# Patient Record
Sex: Male | Born: 1975 | Race: White | Hispanic: No | Marital: Married | Smoking: Never smoker
Health system: Southern US, Community
[De-identification: ages and names within clinical notes are randomized; demographics above are authoritative.]

## PROBLEM LIST (undated history)

## (undated) DIAGNOSIS — J45909 Unspecified asthma, uncomplicated: Secondary | ICD-10-CM

## (undated) DIAGNOSIS — N200 Calculus of kidney: Secondary | ICD-10-CM

## (undated) DIAGNOSIS — I10 Essential (primary) hypertension: Secondary | ICD-10-CM

## (undated) HISTORY — DX: Essential (primary) hypertension: I10

## (undated) HISTORY — PX: SPLENECTOMY: SUR1306

---
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
## 2000-11-16 ENCOUNTER — Encounter: Payer: Self-pay | Admitting: Emergency Medicine

## 2000-11-16 ENCOUNTER — Emergency Department (HOSPITAL_COMMUNITY): Admission: EM | Admit: 2000-11-16 | Discharge: 2000-11-16 | Payer: Self-pay | Admitting: Emergency Medicine

## 2003-09-22 ENCOUNTER — Emergency Department (HOSPITAL_COMMUNITY): Admission: EM | Admit: 2003-09-22 | Discharge: 2003-09-22 | Payer: Self-pay | Admitting: Emergency Medicine

## 2008-01-05 ENCOUNTER — Emergency Department (HOSPITAL_COMMUNITY): Admission: EM | Admit: 2008-01-05 | Discharge: 2008-01-05 | Payer: Self-pay | Admitting: Emergency Medicine

## 2008-11-12 ENCOUNTER — Ambulatory Visit: Payer: Self-pay | Admitting: Diagnostic Radiology

## 2008-11-12 ENCOUNTER — Ambulatory Visit (HOSPITAL_BASED_OUTPATIENT_CLINIC_OR_DEPARTMENT_OTHER): Admission: RE | Admit: 2008-11-12 | Discharge: 2008-11-12 | Payer: Self-pay | Admitting: Family Medicine

## 2010-10-11 ENCOUNTER — Emergency Department (HOSPITAL_COMMUNITY)
Admission: EM | Admit: 2010-10-11 | Discharge: 2010-10-12 | Payer: Self-pay | Source: Home / Self Care | Admitting: Emergency Medicine

## 2011-01-09 LAB — URINALYSIS, ROUTINE W REFLEX MICROSCOPIC
Bilirubin Urine: NEGATIVE
Glucose, UA: NEGATIVE mg/dL
Ketones, ur: NEGATIVE mg/dL
Nitrite: NEGATIVE
Protein, ur: NEGATIVE mg/dL
Specific Gravity, Urine: 1.027 (ref 1.005–1.030)
Urobilinogen, UA: 0.2 mg/dL (ref 0.0–1.0)
pH: 5.5 (ref 5.0–8.0)

## 2011-01-09 LAB — DIFFERENTIAL
Eosinophils Absolute: 0.2 10*3/uL (ref 0.0–0.7)
Lymphocytes Relative: 18 % (ref 12–46)
Lymphs Abs: 3.6 10*3/uL (ref 0.7–4.0)
Monocytes Relative: 7 % (ref 3–12)
Neutrophils Relative %: 73 % (ref 43–77)

## 2011-01-09 LAB — COMPREHENSIVE METABOLIC PANEL
ALT: 33 U/L (ref 0–53)
AST: 20 U/L (ref 0–37)
CO2: 28 mEq/L (ref 19–32)
Calcium: 8.7 mg/dL (ref 8.4–10.5)
Creatinine, Ser: 1.02 mg/dL (ref 0.4–1.5)
GFR calc Af Amer: >60 mL/min (ref >60)
GFR calc non Af Amer: >60 mL/min (ref >60)
Glucose, Bld: 105 mg/dL — ABNORMAL HIGH (ref 70–99)
Sodium: 140 mEq/L (ref 135–145)
Total Protein: 7 g/dL (ref 6.0–8.3)

## 2011-01-09 LAB — URINE MICROSCOPIC-ADD ON

## 2011-01-09 LAB — CBC
MCH: 32.2 pg (ref 26.0–34.0)
MCHC: 33.5 g/dL (ref 30.0–36.0)
RDW: 14 % (ref 11.5–15.5)

## 2011-07-23 LAB — CBC
Platelets: 355
RBC: 5.2
WBC: 23 — ABNORMAL HIGH

## 2011-07-23 LAB — COMPREHENSIVE METABOLIC PANEL
ALT: 59 — ABNORMAL HIGH
AST: 26
Albumin: 4.6
Alkaline Phosphatase: 78
CO2: 26
Chloride: 102
Creatinine, Ser: 1.04
GFR calc Af Amer: >60
GFR calc non Af Amer: >60
Potassium: 3.8
Sodium: 138
Total Bilirubin: 1

## 2011-07-23 LAB — DIFFERENTIAL
Basophils Absolute: 0
Eosinophils Absolute: 0
Eosinophils Relative: 0
Lymphocytes Relative: 3 — ABNORMAL LOW
Monocytes Absolute: 1.1 — ABNORMAL HIGH

## 2011-07-23 LAB — URINE MICROSCOPIC-ADD ON

## 2011-07-23 LAB — URINALYSIS, ROUTINE W REFLEX MICROSCOPIC
Glucose, UA: NEGATIVE
Hgb urine dipstick: NEGATIVE
Protein, ur: 30 — AB
pH: 6

## 2012-01-10 IMAGING — CT CT ABD-PELV W/O CM
2 of 4 series · 17 of 46 positions shown, 19 images · non-contrast
Comparison: None

CLINICAL DATA: Abdominal pain

CT ABDOMEN AND PELVIS WITHOUT CONTRAST
TECHNIQUE: Multidetector CT imaging of the abdomen and pelvis was
performed following the standard protocol without intravenous
contrast.

[Series 2: renal stone · axial · 0.82mm/px · z∈[-432,-17]mm · 14 of 91 slices shown, 16 images]
[im 4/91  soft-tissue]
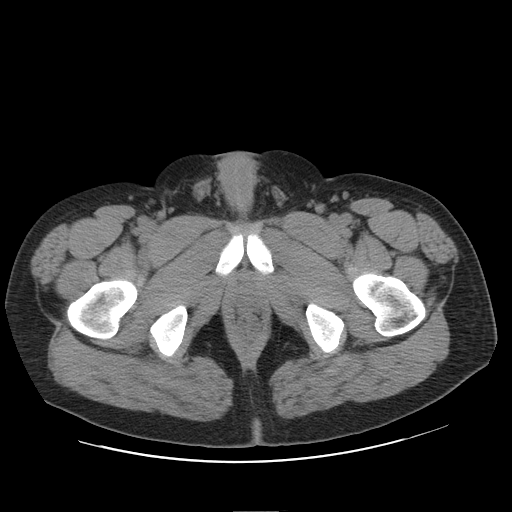
[im 4/91  bone]
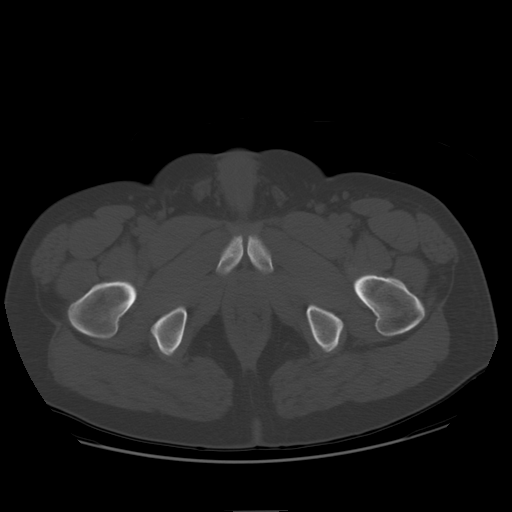
[im 12/91  soft-tissue]
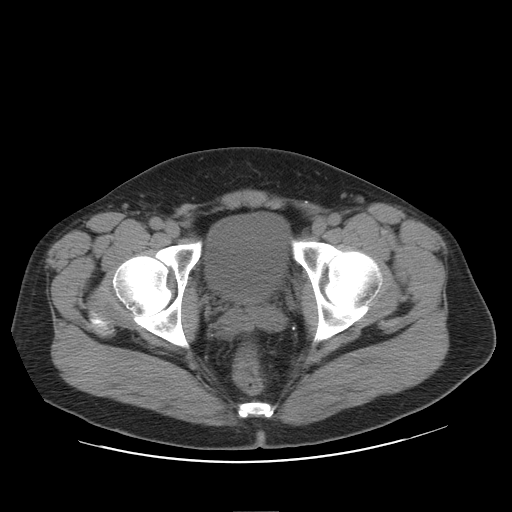
[im 16/91  soft-tissue]
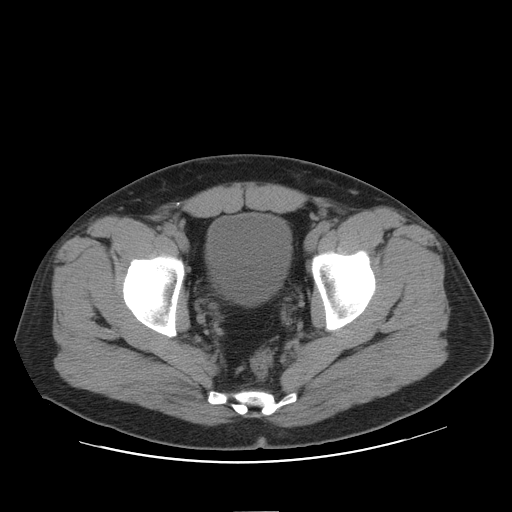
[im 24/91  soft-tissue]
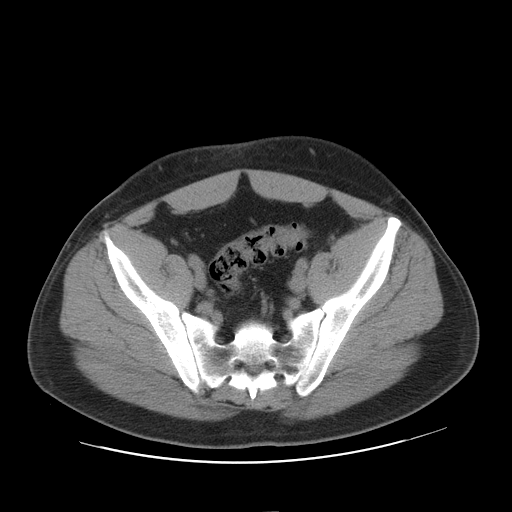
[im 32/91  soft-tissue]
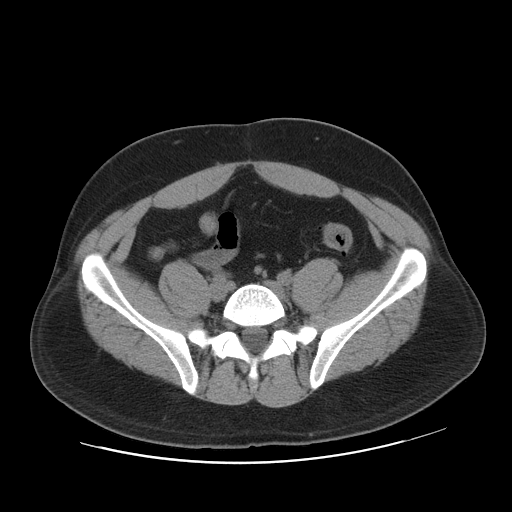
[im 36/91  soft-tissue]
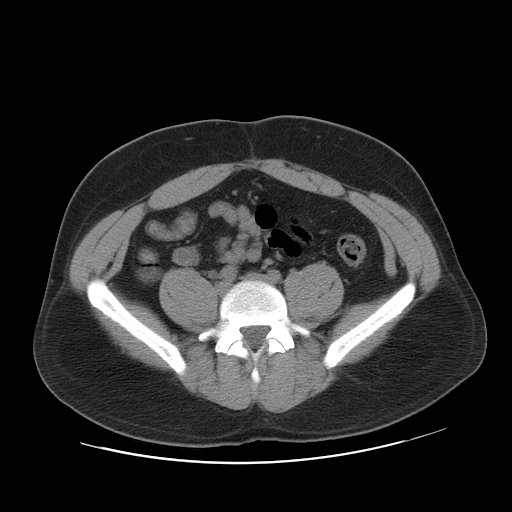
[im 44/91  soft-tissue]
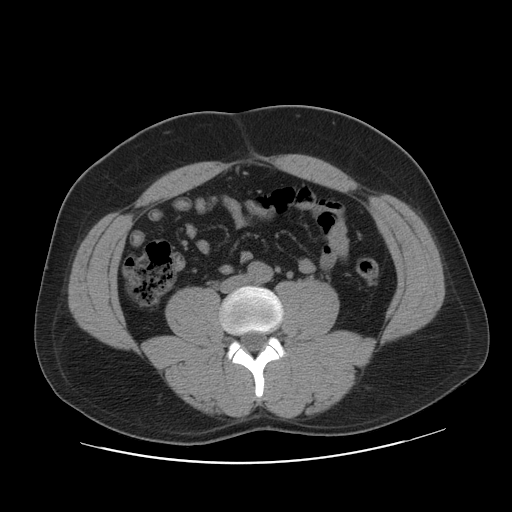
[im 47/91  soft-tissue]
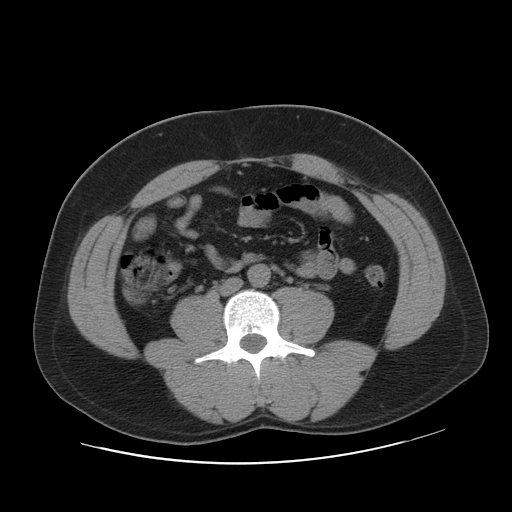
[im 55/91  soft-tissue]
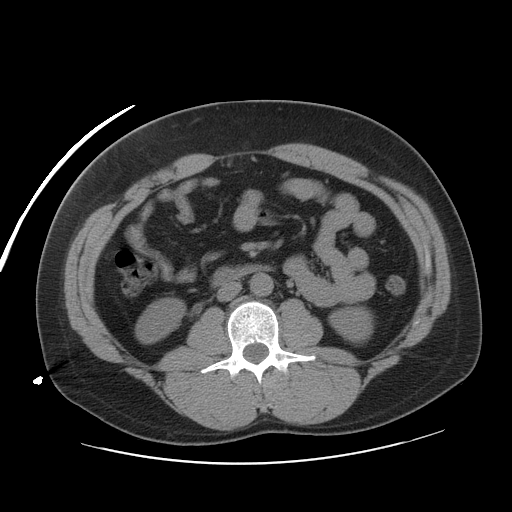
[im 55/91  bone]
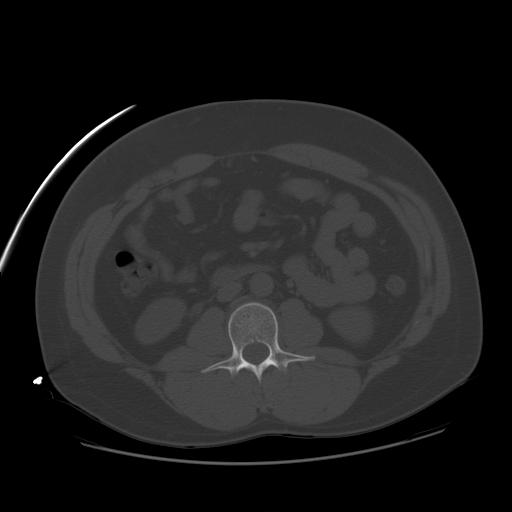
[im 59/91  soft-tissue]
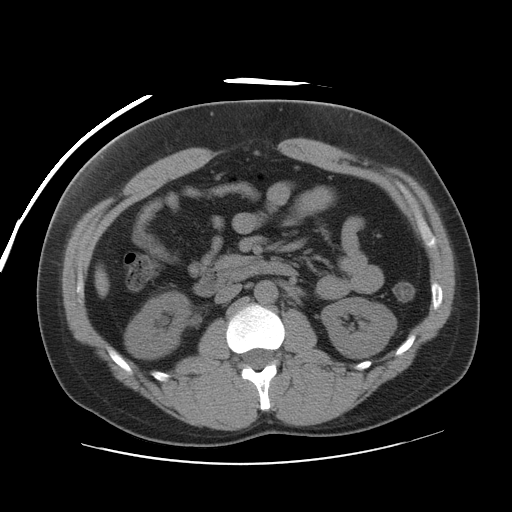
[im 67/91  soft-tissue]
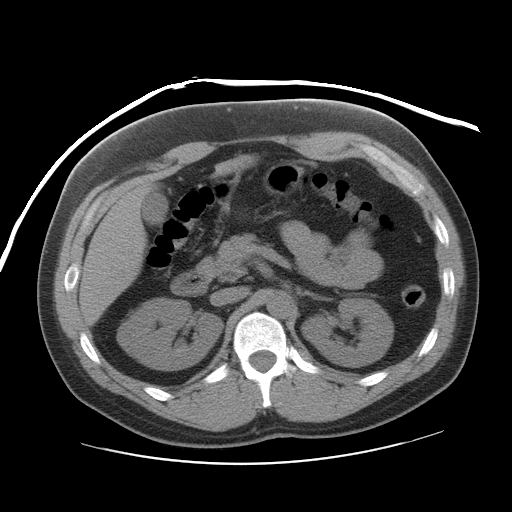
[im 75/91  soft-tissue]
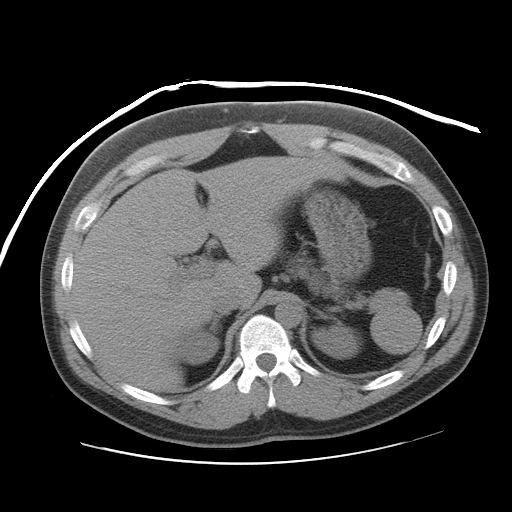
[im 79/91  soft-tissue]
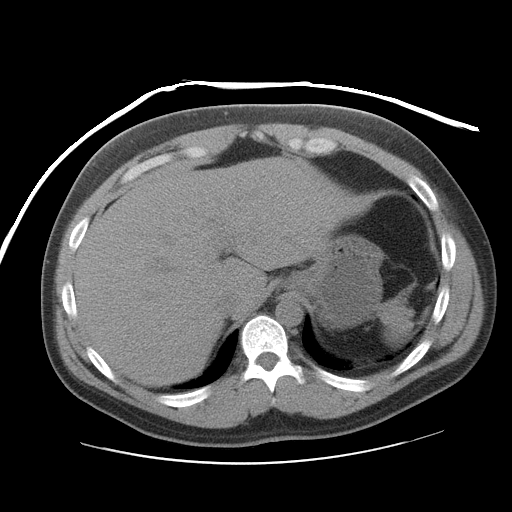
[im 87/91  soft-tissue]
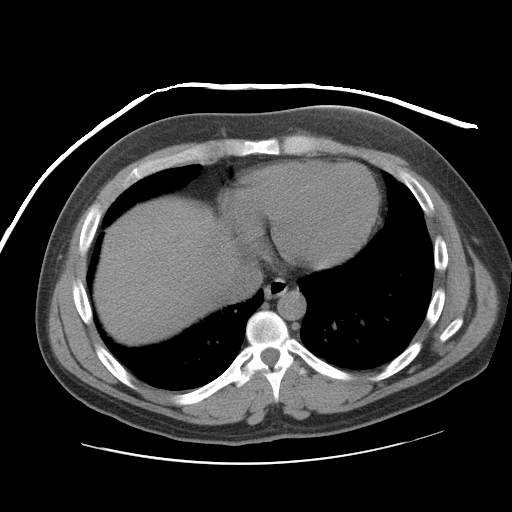

[Series 401: reformatted · coronal · 0.98mm/px · 3 of 103 slices shown]
[im 35/103  soft-tissue]
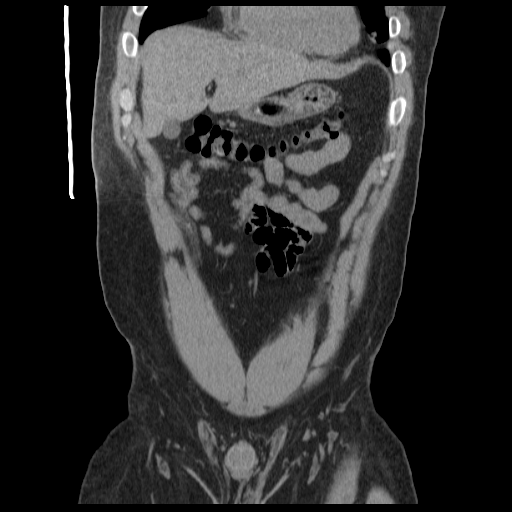
[im 46/103  soft-tissue]
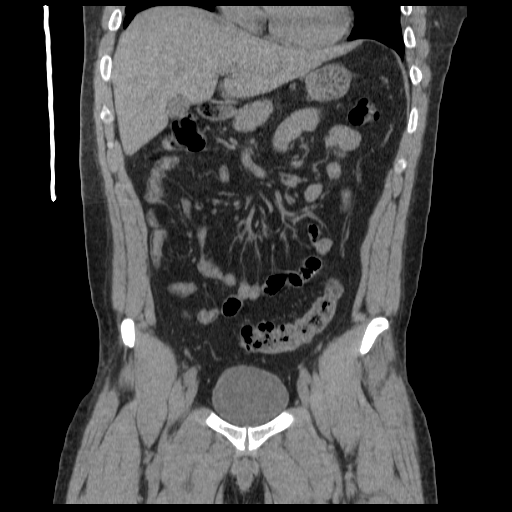
[im 57/103  soft-tissue]
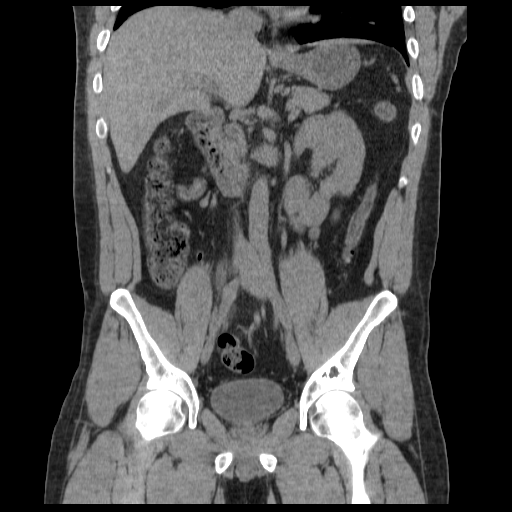

[17 of 46 positions shown; findings below may reference images not displayed]

FINDINGS: The lung bases are clear.

Status post splenectomy.  There is an accessory spleen identified
within the left upper quadrant of the abdomen measuring 3.9 cm.
The pancreas appears normal.

Both adrenal glands are normal.

No focal liver abnormality noted.

The gallbladder appears normal.  There is no biliary ductal
dilatation.  Pancreas is negative.  Both adrenal glands are
negative.

There is right-sided pelvocaliectasis and hydroureter.  Within the
urinary bladder at the right UVJ there is a stone which measures
approximately 2-3 mm, image 79.  There is a nonobstructing stone
within the inferior pole of the left kidney which measures 3-4 mm,
image 32.  No left-sided hydronephrosis or hydroureter.

No enlarged upper abdominal lymph nodes.

There is no inguinal or pelvic adenopathy.  The stomach and the
small bowel loops are normal.  There are scattered colonic
diverticuli without active inflammation.  No free fluid or fluid
collections within the abdomen or pelvis.
IMPRESSION: 1.  There is a tiny stone within the right UVJ which results and
mild right pelvocaliectasis and hydroureter.

## 2012-06-01 ENCOUNTER — Emergency Department (HOSPITAL_COMMUNITY): Payer: Self-pay

## 2012-06-01 ENCOUNTER — Inpatient Hospital Stay (HOSPITAL_COMMUNITY)
Admission: EM | Admit: 2012-06-01 | Discharge: 2012-06-04 | DRG: 392 | Disposition: A | Discharge status: Home / Self Care | Payer: MEDICAID | Attending: Family Medicine | Admitting: Family Medicine

## 2012-06-01 ENCOUNTER — Encounter (HOSPITAL_COMMUNITY): Payer: Self-pay | Admitting: Emergency Medicine

## 2012-06-01 DIAGNOSIS — D72829 Elevated white blood cell count, unspecified: Secondary | ICD-10-CM | POA: Diagnosis present

## 2012-06-01 DIAGNOSIS — K630 Abscess of intestine: Secondary | ICD-10-CM | POA: Diagnosis present

## 2012-06-01 DIAGNOSIS — N201 Calculus of ureter: Secondary | ICD-10-CM | POA: Diagnosis present

## 2012-06-01 DIAGNOSIS — K5732 Diverticulitis of large intestine without perforation or abscess without bleeding: Principal | ICD-10-CM | POA: Diagnosis present

## 2012-06-01 DIAGNOSIS — R109 Unspecified abdominal pain: Secondary | ICD-10-CM

## 2012-06-01 DIAGNOSIS — Z79899 Other long term (current) drug therapy: Secondary | ICD-10-CM

## 2012-06-01 DIAGNOSIS — N1330 Unspecified hydronephrosis: Secondary | ICD-10-CM | POA: Diagnosis present

## 2012-06-01 DIAGNOSIS — N200 Calculus of kidney: Secondary | ICD-10-CM | POA: Diagnosis present

## 2012-06-01 DIAGNOSIS — Z87442 Personal history of urinary calculi: Secondary | ICD-10-CM

## 2012-06-01 DIAGNOSIS — N179 Acute kidney failure, unspecified: Secondary | ICD-10-CM | POA: Diagnosis present

## 2012-06-01 DIAGNOSIS — K5792 Diverticulitis of intestine, part unspecified, without perforation or abscess without bleeding: Secondary | ICD-10-CM

## 2012-06-01 DIAGNOSIS — L0291 Cutaneous abscess, unspecified: Secondary | ICD-10-CM

## 2012-06-01 HISTORY — DX: Calculus of kidney: N20.0

## 2012-06-01 LAB — POCT I-STAT, CHEM 8
Calcium, Ion: 1.18 mmol/L (ref 1.12–1.23)
Chloride: 103 mEq/L (ref 96–112)
Creatinine, Ser: 1.3 mg/dL (ref 0.50–1.35)
Glucose, Bld: 102 mg/dL — ABNORMAL HIGH (ref 70–99)
Potassium: 3.6 mEq/L (ref 3.5–5.1)

## 2012-06-01 LAB — CBC WITH DIFFERENTIAL/PLATELET
Basophils Relative: 0 % (ref 0–1)
Eosinophils Relative: 1 % (ref 0–5)
HCT: 41.9 % (ref 39.0–52.0)
Hemoglobin: 14.1 g/dL (ref 13.0–17.0)
Lymphocytes Relative: 15 % (ref 12–46)
Monocytes Relative: 8 % (ref 3–12)
Neutro Abs: 15.9 10*3/uL — ABNORMAL HIGH (ref 1.7–7.7)
WBC: 21 10*3/uL — ABNORMAL HIGH (ref 4.0–10.5)

## 2012-06-01 LAB — URINALYSIS, ROUTINE W REFLEX MICROSCOPIC
Bilirubin Urine: NEGATIVE
Ketones, ur: NEGATIVE mg/dL
Specific Gravity, Urine: 1.024 (ref 1.005–1.030)
Urobilinogen, UA: 0.2 mg/dL (ref 0.0–1.0)
pH: 6.5 (ref 5.0–8.0)

## 2012-06-01 LAB — URINE MICROSCOPIC-ADD ON

## 2012-06-01 MED ORDER — HYDROMORPHONE HCL PF 1 MG/ML IJ SOLN: 1.0000 mg | INTRAMUSCULAR | Status: DC | PRN

## 2012-06-01 MED ORDER — SODIUM CHLORIDE 0.9 % IV SOLN: INTRAVENOUS | Status: AC

## 2012-06-01 MED ORDER — PIPERACILLIN-TAZOBACTAM 3.375 G IVPB
3.3750 g | Freq: Three times a day (TID) | INTRAVENOUS | Status: DC
  Administered 2012-06-02 – 2012-06-04 (×9): 3.375 g via INTRAVENOUS
  Filled 2012-06-01 (×10): qty 50

## 2012-06-01 MED ORDER — PIPERACILLIN-TAZOBACTAM 3.375 G IVPB: 3.3750 g | Freq: Four times a day (QID) | INTRAVENOUS | Status: DC

## 2012-06-01 MED ORDER — TAMSULOSIN HCL 0.4 MG PO CAPS: 0.4000 mg | ORAL_CAPSULE | Freq: Every day | ORAL | Status: DC

## 2012-06-01 MED ORDER — HYDROMORPHONE HCL PF 1 MG/ML IJ SOLN
1.0000 mg | Freq: Once | INTRAMUSCULAR | Status: AC
  Administered 2012-06-01: 1 mg via INTRAVENOUS
  Filled 2012-06-01: qty 1

## 2012-06-01 MED ORDER — IBUPROFEN 600 MG PO TABS: 600.0000 mg | ORAL_TABLET | Freq: Four times a day (QID) | ORAL | Status: DC | PRN

## 2012-06-01 MED ORDER — KETOROLAC TROMETHAMINE 30 MG/ML IJ SOLN
30.0000 mg | Freq: Once | INTRAMUSCULAR | Status: AC
  Administered 2012-06-01: 30 mg via INTRAVENOUS
  Filled 2012-06-01: qty 1

## 2012-06-01 MED ORDER — OXYCODONE-ACETAMINOPHEN 5-325 MG PO TABS: 2.0000 | ORAL_TABLET | ORAL | Status: DC | PRN

## 2012-06-01 MED ORDER — ONDANSETRON HCL 4 MG/2ML IJ SOLN
4.0000 mg | Freq: Once | INTRAMUSCULAR | Status: AC
  Administered 2012-06-01: 4 mg via INTRAVENOUS
  Filled 2012-06-01: qty 2

## 2012-06-01 MED ORDER — ONDANSETRON HCL 4 MG/2ML IJ SOLN: 4.0000 mg | Freq: Four times a day (QID) | INTRAMUSCULAR | Status: DC | PRN

## 2012-06-01 MED ORDER — OXYCODONE-ACETAMINOPHEN 5-325 MG PO TABS
1.0000 | ORAL_TABLET | ORAL | Status: DC | PRN
  Administered 2012-06-02 – 2012-06-03 (×3): 1 via ORAL
  Filled 2012-06-01 (×3): qty 1

## 2012-06-01 MED ORDER — TAMSULOSIN HCL 0.4 MG PO CAPS
0.4000 mg | ORAL_CAPSULE | Freq: Every day | ORAL | Status: DC
  Administered 2012-06-02 – 2012-06-03 (×3): 0.4 mg via ORAL
  Filled 2012-06-01 (×4): qty 1

## 2012-06-01 MED ORDER — METRONIDAZOLE IN NACL 5-0.79 MG/ML-% IV SOLN
500.0000 mg | Freq: Once | INTRAVENOUS | Status: AC
  Administered 2012-06-01: 500 mg via INTRAVENOUS
  Filled 2012-06-01: qty 100

## 2012-06-01 MED ORDER — CIPROFLOXACIN IN D5W 400 MG/200ML IV SOLN
400.0000 mg | Freq: Once | INTRAVENOUS | Status: AC
  Administered 2012-06-01: 400 mg via INTRAVENOUS
  Filled 2012-06-01: qty 200

## 2012-06-01 NOTE — ED Provider Notes (Signed)
History     CSN: [id number]  Arrival date & time 06/01/12  1728   First MD Initiated Contact with Patient 06/01/12 1913      Chief Complaint  Patient presents with  . Flank Pain    (Consider location/radiation/quality/duration/timing/severity/associated sxs/prior treatment) Patient is a 36 y.o. male presenting with flank pain. The history is provided by the patient.  Flank Pain This is a new problem. The current episode started today. The problem occurs constantly. Associated symptoms include abdominal pain and nausea. Pertinent negatives include no chills, diaphoresis, fever, headaches, myalgias, vomiting or weakness. Nothing aggravates the symptoms. He has tried nothing for the symptoms.  PT states pain sudden onset, waxing and waning. Hx of kidney stone, feels the same. No prior lithotripsy. No urinary symptoms or hematuria. No fever.   Past Medical History  Diagnosis Date  . Kidney stones     History reviewed. No pertinent past surgical history.  No family history on file.  History  Substance Use Topics  . Smoking status: Never Smoker   . Smokeless tobacco: Not on file  . Alcohol Use: Yes      Review of Systems  Constitutional: Negative for fever, chills and diaphoresis.  Respiratory: Negative.   Cardiovascular: Negative.   Gastrointestinal: Positive for nausea and abdominal pain. Negative for vomiting, diarrhea and constipation.  Genitourinary: Positive for flank pain. Negative for dysuria, hematuria, scrotal swelling, difficulty urinating and testicular pain.  Musculoskeletal: Negative for myalgias.  Neurological: Negative for dizziness, weakness, light-headedness and headaches.    Allergies  Review of patient's allergies indicates no known allergies.  Home Medications  No current outpatient prescriptions on file.  BP 167/109  Pulse 79  Temp 99 F (37.2 C) (Oral)  Resp 18  SpO2 98%  Physical Exam  Nursing note and vitals reviewed. Constitutional: He  is oriented to person, place, and time. He appears well-developed and well-nourished. No distress.  HENT:  Head: Normocephalic.  Eyes: Conjunctivae are normal.  Neck: Neck supple.  Cardiovascular: Normal rate, regular rhythm and normal heart sounds.   Pulmonary/Chest: Effort normal and breath sounds normal. No respiratory distress. He has no wheezes. He has no rales.  Abdominal: Soft. Bowel sounds are normal. He exhibits no distension. There is no rebound and no guarding.       No CVA tenderness, mild LUQ tenderness  Musculoskeletal: He exhibits no edema.  Neurological: He is alert and oriented to person, place, and time.  Skin: Skin is warm and dry.  Psychiatric: He has a normal mood and affect.    ED Course  Procedures (including critical care time)  Pt with left flank pain, acute onset, colicky, feels like prior kidney stones. UA, CT abd ordered, pain meds ordered.   Results for orders placed during the hospital encounter of 06/01/12  URINALYSIS, ROUTINE W REFLEX MICROSCOPIC      Component Value Range   Color, Urine YELLOW  YELLOW   APPearance CLOUDY (*) CLEAR   Specific Gravity, Urine 1.024  1.005 - 1.030   pH 6.5  5.0 - 8.0   Glucose, UA NEGATIVE  NEGATIVE mg/dL   Hgb urine dipstick LARGE (*) NEGATIVE   Bilirubin Urine NEGATIVE  NEGATIVE   Ketones, ur NEGATIVE  NEGATIVE mg/dL   Protein, ur NEGATIVE  NEGATIVE mg/dL   Urobilinogen, UA 0.2  0.0 - 1.0 mg/dL   Nitrite NEGATIVE  NEGATIVE   Leukocytes, UA NEGATIVE  NEGATIVE  POCT I-STAT, CHEM 8      Component Value Range  Sodium 141  135 - 145 mEq/L   Potassium 3.6  3.5 - 5.1 mEq/L   Chloride 103  96 - 112 mEq/L   BUN 15  6 - 23 mg/dL   Creatinine, Ser 1.61  0.50 - 1.35 mg/dL   Glucose, Bld 096 (*) 70 - 99 mg/dL   Calcium, Ion 0.45  4.09 - 1.23 mmol/L   TCO2 25  0 - 100 mmol/L   Hemoglobin 15.0  13.0 - 17.0 g/dL   HCT 81.1  91.4 - 78.2 %  URINE MICROSCOPIC-ADD ON      Component Value Range   RBC / HPF 11-20  <3 RBC/hpf    Crystals CA OXALATE CRYSTALS (*) NEGATIVE   Urine-Other AMORPHOUS URATES/PHOSPHATES    CBC WITH DIFFERENTIAL      Component Value Range   WBC 21.0 (*) 4.0 - 10.5 K/uL   RBC 4.49  4.22 - 5.81 MIL/uL   Hemoglobin 14.1  13.0 - 17.0 g/dL   HCT 95.6  21.3 - 08.6 %   MCV 93.3  78.0 - 100.0 fL   MCH 31.4  26.0 - 34.0 pg   MCHC 33.7  30.0 - 36.0 g/dL   RDW 57.8  46.9 - 62.9 %   Platelets 427 (*) 150 - 400 K/uL   Neutrophils Relative 76  43 - 77 %   Lymphocytes Relative 15  12 - 46 %   Monocytes Relative 8  3 - 12 %   Eosinophils Relative 1  0 - 5 %   Basophils Relative 0  0 - 1 %   Neutro Abs 15.9 (*) 1.7 - 7.7 K/uL   Lymphs Abs 3.2  0.7 - 4.0 K/uL   Monocytes Absolute 1.7 (*) 0.1 - 1.0 K/uL   Eosinophils Absolute 0.2  0.0 - 0.7 K/uL   Basophils Absolute 0.0  0.0 - 0.1 K/uL   Smear Review MORPHOLOGY UNREMARKABLE    CBC      Component Value Range   WBC 12.5 (*) 4.0 - 10.5 K/uL   RBC 3.94 (*) 4.22 - 5.81 MIL/uL   Hemoglobin 12.5 (*) 13.0 - 17.0 g/dL   HCT 52.8 (*) 41.3 - 24.4 %   MCV 92.6  78.0 - 100.0 fL   MCH 31.7  26.0 - 34.0 pg   MCHC 34.2  30.0 - 36.0 g/dL   RDW 01.0  27.2 - 53.6 %   Platelets 357  150 - 400 K/uL  BASIC METABOLIC PANEL      Component Value Range   Sodium 140  135 - 145 mEq/L   Potassium 3.1 (*) 3.5 - 5.1 mEq/L   Chloride 104  96 - 112 mEq/L   CO2 27  19 - 32 mEq/L   Glucose, Bld 101 (*) 70 - 99 mg/dL   BUN 8  6 - 23 mg/dL   Creatinine, Ser 6.44 (*) 0.50 - 1.35 mg/dL   Calcium 8.1 (*) 8.4 - 10.5 mg/dL   GFR calc non Af Amer 64 (*) >90 mL/min   GFR calc Af Amer 74 (*) >90 mL/min    Pt's pain improved. Signed out at shift change. CT pending.       No diagnosis found.    MDM          Lottie Mussel, PA 06/03/12 1642

## 2012-06-01 NOTE — Consult Note (Signed)
Reason for Consult:diverticulitis with abscess Referring Physician: Pickering/Anne Dominic Mahaney Yates is an 36 y.o. male.  HPI: we were consulted to evaluate this patient for diverticulitis and abscess found on CT scan which was ordered for a left kidney stone. He has a history of kidney stones and he presented to the emergency room this afternoon complaining of left back and flank pain which she describes as "sharp" and intermittent similar to his prior episodes. He denies any abdominal pain. He says that he had some nausea secondary to the pain which at times was rated as a 10 out of 10 pain but currently he only has mild pain which she grades as a 2/10. He denies any hematuria or fevers or chills or vomiting. He denies any blood in the stools or melena.  Past Medical History  Diagnosis Date  . Kidney stones     History reviewed. No pertinent past surgical history.  No family history on file.  Social History:  reports that he has never smoked. He does not have any smokeless tobacco history on file. He reports that he drinks alcohol. He reports that he does not use illicit drugs.  Allergies: No Known Allergies  Medications: I have reviewed the patient's current medications.  Results for orders placed during the hospital encounter of 06/01/12 (from the past 48 hour(s))  URINALYSIS, ROUTINE W REFLEX MICROSCOPIC     Status: Abnormal   Collection Time   06/01/12  7:25 PM      Component Value Range Comment   Color, Urine YELLOW  YELLOW    APPearance CLOUDY (*) CLEAR    Specific Gravity, Urine 1.024  1.005 - 1.030    pH 6.5  5.0 - 8.0    Glucose, UA NEGATIVE  NEGATIVE mg/dL    Hgb urine dipstick LARGE (*) NEGATIVE    Bilirubin Urine NEGATIVE  NEGATIVE    Ketones, ur NEGATIVE  NEGATIVE mg/dL    Protein, ur NEGATIVE  NEGATIVE mg/dL    Urobilinogen, UA 0.2  0.0 - 1.0 mg/dL    Nitrite NEGATIVE  NEGATIVE    Leukocytes, UA NEGATIVE  NEGATIVE   URINE MICROSCOPIC-ADD ON     Status:  Abnormal   Collection Time   06/01/12  7:25 PM      Component Value Range Comment   RBC / HPF 11-20  <3 RBC/hpf    Crystals CA OXALATE CRYSTALS (*) NEGATIVE    Urine-Other AMORPHOUS URATES/PHOSPHATES     CBC WITH DIFFERENTIAL     Status: Abnormal   Collection Time   06/01/12  7:34 PM      Component Value Range Comment   WBC 21.0 (*) 4.0 - 10.5 K/uL    RBC 4.49  4.22 - 5.81 MIL/uL    Hemoglobin 14.1  13.0 - 17.0 g/dL    HCT 16.1  09.6 - 04.5 %    MCV 93.3  78.0 - 100.0 fL    MCH 31.4  26.0 - 34.0 pg    MCHC 33.7  30.0 - 36.0 g/dL    RDW 40.9  81.1 - 91.4 %    Platelets 427 (*) 150 - 400 K/uL    Neutrophils Relative 76  43 - 77 %    Lymphocytes Relative 15  12 - 46 %    Monocytes Relative 8  3 - 12 %    Eosinophils Relative 1  0 - 5 %    Basophils Relative 0  0 - 1 %  Neutro Abs 15.9 (*) 1.7 - 7.7 K/uL    Lymphs Abs 3.2  0.7 - 4.0 K/uL    Monocytes Absolute 1.7 (*) 0.1 - 1.0 K/uL    Eosinophils Absolute 0.2  0.0 - 0.7 K/uL    Basophils Absolute 0.0  0.0 - 0.1 K/uL    Smear Review MORPHOLOGY UNREMARKABLE     POCT I-STAT, CHEM 8     Status: Abnormal   Collection Time   06/01/12  7:51 PM      Component Value Range Comment   Sodium 141  135 - 145 mEq/L    Potassium 3.6  3.5 - 5.1 mEq/L    Chloride 103  96 - 112 mEq/L    BUN 15  6 - 23 mg/dL    Creatinine, Ser 1.61  0.50 - 1.35 mg/dL    Glucose, Bld 096 (*) 70 - 99 mg/dL    Calcium, Ion 0.45  4.09 - 1.23 mmol/L    TCO2 25  0 - 100 mmol/L    Hemoglobin 15.0  13.0 - 17.0 g/dL    HCT 81.1  91.4 - 78.2 %     Ct Abdomen Pelvis Wo Contrast  06/01/2012  *RADIOLOGY REPORT*  Clinical Data: Flank pain, history of stone disease, low pelvic pain, prior splenectomy  CT ABDOMEN AND PELVIS WITHOUT CONTRAST  Technique:  Multidetector CT imaging of the abdomen and pelvis was performed following the standard protocol without intravenous contrast.  Comparison: 10/11/2010  Findings: Lung bases clear.  Normal heart size.  No pericardial or pleural  effusion.  No hiatal hernia.  Abdomen:  Prior splenectomy noted.  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  Liver, gallbladder, biliary system, pancreas, adrenal glands, and right kidney demonstrate no acute process and are within normal limits for noncontrast study.  Left kidney demonstrates acute perinephric inflammation with mild hydronephrosis hydroureter secondary to a proximal obstructing left ureteral 6 mm calculus, image 50.  No additional lower urinary tract obstructing calculus evident. Incidental punctate nonobstructing left lower pole renal calculus.  Additionally, left lower quadrant inflammatory changes are noted about the sigmoid colon.  Diverticular disease noted in this region.  Small extraluminal fluid collection noted anterior to the sigmoid colon measuring 24 x 16 mm, image 70.  This is compatible with acute diverticulitis and a small pericolonic abscess.  No associate obstruction pattern or ileus.  Normal appendix demonstrated.  Pelvis:  No distal pelvic ureteral obstruction or UVJ abnormality. Unremarkable bladder.  No pelvic free fluid, hemorrhage, adenopathy, inguinal abnormality, or hernia.  No acute osseous finding.  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  IMPRESSION: Acute obstructing proximal left ureteral 6 mm calculus with mild hydronephrosis and hydroureter.  Acute left lower quadrant sigmoid diverticulitis with an adjacent small 2 cm pericolonic abscess.  Original Report Authenticated By: Judie Petit. Ruel Favors, M.D.   All other review of systems negative or noncontributory except as stated in the HPI  Blood pressure 152/109, pulse 74, temperature 98.7 F (37.1 C), temperature source Oral, resp. rate 16, SpO2 94.00%. General appearance: alert, cooperative and no distress Head: Normocephalic, without obvious abnormality, atraumatic Eyes: conjunctivae/corneas clear. PERRL, EOM's intact.  Fundi benign. Neck: no JVD and supple, symmetrical, trachea midline Back: negative, symmetric, no curvature. ROM normal. No CVA tenderness. Resp: clear to auscultation bilaterally Cardio: regular rate and rhythm, S1, S2 normal, no murmur, click, rub or gallop GI: soft, non-tender; bowel sounds normal; no masses,  no organomegaly  and completely nontender on exam Extremities: extremities normal, atraumatic, no cyanosis or edema Pulses: 2+ and symmetric Skin: Skin color, texture, turgor normal. No rashes or lesions Neurologic: Grossly normal  Assessment/Plan: Diverticulits with possible abscess Left kidney stone Leukocytosis  This is an interesting situation. He appears to have diverticulitis with possible abscess on CT scan but he has no fevers or abdominal pain or any other manifestations of this. He has symptoms of consistent with a left kidney stone and he says that this episode is exactly the same as his prior episodes of kidney stones. However, if he does have a white blood cell count of 21,000. This may be due to his kidney stone but could also be diverticulitis as well. Though I do not think that any surgery is needed at this time, I think it is reasonable to admit him for bowel rest and IV fluids and antibiotic therapy. I would treat his diverticulitis clinically. Again, do not think any surgical intervention would be necessary at this time and he should do fine with nonoperative treatment including antibiotics and bowel rest. Surgery will follow.  Lodema Pilot DAVID 06/01/2012, 11:14 PM

## 2012-06-01 NOTE — H&P (Signed)
[facility] History and Physical  Jeremiah Yates KGM:[id number] DOB: 01-30-1976 DOA: 06/01/2012  Referring physician: Cameron Proud PCP: No primary provider on file.   Chief Complaint: left back and flank pain  HPI:  36 yo male with hx of nephrolithiasis c/o left back and flank starting today about 2pm.  Slight subjective fever ("feeling hot"), but denies chills, n/v, abd pain, diarrhea, constipation, brbrp, black stool.  Pt came to ED for evaluation.    In ED CT scan showed 2cm diverticular abscess and evidence of nephrolithiasis.  ED has consulted surgery and Urology, and surgery thinks medical management of the diverticular abscess is the most prudent course of action at this time with iv abx. Urology will be by to evaluate the patient in terms of the nephrolithiasis and hydronephrosis.  We appreciate their input.   Review of Systems:  Negative for all organ systems except for + above hpi  Past Medical History  Diagnosis Date  . Kidney stones    Past Surgical History  Procedure Date  . Splenectomy circa 1998   Social History:  reports that he has never smoked. He has never used smokeless tobacco. He reports that he drinks about .5 ounces of alcohol per week. He reports that he does not use illicit drugs. Lives at home with partner Patient can  participate in ADLs  No Known Allergies  Family History  Problem Relation Age of Onset  . Lung cancer Father     alive at age 35  . Breast cancer Mother     alive at age 63     (be sure to complete)  Prior to Admission medications   Medication Sig Start Date End Date Taking? Authorizing Provider  ibuprofen (ADVIL,MOTRIN) 600 MG tablet Take 1 tablet (600 mg total) by mouth every 6 (six) hours as needed for pain. 06/01/12 06/11/12  Tatyana A Kirichenko, PA  oxyCODONE-acetaminophen (PERCOCET/ROXICET) 5-325 MG per tablet Take 2 tablets by mouth every 4 (four) hours as needed for pain. 06/01/12 06/11/12  Tatyana A Kirichenko, PA    Tamsulosin HCl (FLOMAX) 0.4 MG CAPS Take 1 capsule (0.4 mg total) by mouth daily. 06/01/12   Lottie Mussel, PA   Physical Exam: Filed Vitals:   06/01/12 1737 06/01/12 2306  BP: 167/109 152/109  Pulse: 79 74  Temp: 99 F (37.2 C) 98.7 F (37.1 C)  TempSrc: Oral Oral  Resp: 18 16  SpO2: 98% 94%     General:  Young male  Eyes: anicteric, pupils 1.6mm symmetric, direct, consensual intact  ENT: mmm  Neck: no jvd, no bruit, no tm  Cardiovascular: rrr s1, s2  Respiratory: ctab  Abdomen: soft, nt, nd, +bs,  Skin: no rash, + tatoo  Musculoskeletal: no muscle tone  Psychiatric: no depression axox3  Neurologic: nonfocal, cn2-12 intact, reflexes 2+ symmetric, diffuse with downgoing toes bilaterally, motor 5/5 in all 4 ext, pinprik intact  Labs on Admission:  Basic Metabolic Panel:  Lab 06/01/12 6440  NA 141  K 3.6  CL 103  CO2 --  GLUCOSE 102*  BUN 15  CREATININE 1.30  CALCIUM --  MG --  PHOS --   Liver Function Tests: No results found for this basename: AST:5,ALT:5,ALKPHOS:5,BILITOT:5,PROT:5,ALBUMIN:5 in the last 168 hours No results found for this basename: LIPASE:5,AMYLASE:5 in the last 168 hours No results found for this basename: AMMONIA:5 in the last 168 hours CBC:  Lab 06/01/12 1951 06/01/12 1934  WBC -- 21.0*  NEUTROABS -- 15.9*  HGB 15.0 14.1  HCT  44.0 41.9  MCV -- 93.3  PLT -- 427*   Cardiac Enzymes: No results found for this basename: CKTOTAL:5,CKMB:5,CKMBINDEX:5,TROPONINI:5 in the last 168 hours  BNP (last 3 results) No results found for this basename: PROBNP:3 in the last 8760 hours CBG: No results found for this basename: GLUCAP:5 in the last 168 hours  Radiological Exams on Admission: Ct Abdomen Pelvis Wo Contrast  06/01/2012  *RADIOLOGY REPORT*  Clinical Data: Flank pain, history of stone disease, low pelvic pain, prior splenectomy  CT ABDOMEN AND PELVIS WITHOUT CONTRAST  Technique:  Multidetector CT imaging of the abdomen and  pelvis was performed following the standard protocol without intravenous contrast.  Comparison: 10/11/2010  Findings: Lung bases clear.  Normal heart size.  No pericardial or pleural effusion.  No hiatal hernia.  Abdomen:  Prior splenectomy noted.  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  Liver, gallbladder, biliary system, pancreas, adrenal glands, and right kidney demonstrate no acute process and are within normal limits for noncontrast study.  Left kidney demonstrates acute perinephric inflammation with mild hydronephrosis hydroureter secondary to a proximal obstructing left ureteral 6 mm calculus, image 50.  No additional lower urinary tract obstructing calculus evident. Incidental punctate nonobstructing left lower pole renal calculus.  Additionally, left lower quadrant inflammatory changes are noted about the sigmoid colon.  Diverticular disease noted in this region.  Small extraluminal fluid collection noted anterior to the sigmoid colon measuring 24 x 16 mm, image 70.  This is compatible with acute diverticulitis and a small pericolonic abscess.  No associate obstruction pattern or ileus.  Normal appendix demonstrated.  Pelvis:  No distal pelvic ureteral obstruction or UVJ abnormality. Unremarkable bladder.  No pelvic free fluid, hemorrhage, adenopathy, inguinal abnormality, or hernia.  No acute osseous finding.  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  IMPRESSION: Acute obstructing proximal left ureteral 6 mm calculus with mild hydronephrosis and hydroureter.  Acute left lower quadrant sigmoid diverticulitis with an adjacent small 2 cm pericolonic abscess.  Original Report Authenticated By: Judie Petit. Ruel Favors, M.D.    EKG:   Assessment/Plan Active Problems:  Intestinal diverticular abscess  Nephrolithiasis   1. Diverticular abscess:  tx with iv zosyn, surgery consult, appreciate input, cbc, cmp in  am 2. Nephrolithiasis:  Urology has been consulted,  Appreciate input in light of hydronephrosis and hx of splenectomy, alpha blocker, pain control  Code Status: Full code Family Communication: Milinda Antis [phone] (partner) Disposition Plan:  Home after stable  Time spent: 35 minutes  Pearson Grippe [facility] Pager [phone] tonight only otherwise [facility] [employer] [email or web address] Password [email or web address] 06/01/2012, 11:32 PM

## 2012-06-01 NOTE — ED Provider Notes (Signed)
0800 Report received from [facility]. Patient is awaiting results of his CT scan and then we will disposition him.  0900 CT abdomen and pelvis shows acute obstructing proximal left. Ureteral 6 mm stone with mild hydronephrosis and hydroureter. CT also shows acute left lower quadrant sigmoid diverticulitis with an adjacent small 2 cm pericolonic abscess. Will discuss the case with Dr. Rubin Payor and consult surgery. 0945  spoke to Dr. Selena Batten with the hospitalist and he would prefer that I call surgery and urology before he acceps the patient. Spoke with surgery and they will see the patient in the ER later.   Dr. Vernice Jefferson with urology  will call Dr. Selena Batten. He states that he could send  the patient home with a 6 mm stone. Awaiting surgery to see pt before admitting to either surgery or hospitalists.   2329 Patient will be admitted for IV antibiotics for a LLQ sigoid diverticulitis/abscess and a kidney stone.  Urology and surgery will consult.  Dr. Selena Batten will admit.  VSS. Afebrile.  Received IV cipro and flagyl in the ER. Labs Reviewed  URINALYSIS, ROUTINE W REFLEX MICROSCOPIC - Abnormal; Notable for the following:    APPearance CLOUDY (*)     Hgb urine dipstick LARGE (*)     All other components within normal limits  POCT I-STAT, CHEM 8 - Abnormal; Notable for the following:    Glucose, Bld 102 (*)     All other components within normal limits  URINE MICROSCOPIC-ADD ON - Abnormal; Notable for the following:    Crystals CA OXALATE CRYSTALS (*)     All other components within normal limits  CBC WITH DIFFERENTIAL - Abnormal; Notable for the following:    WBC 21.0 (*)     Platelets 427 (*)     Neutro Abs 15.9 (*)     Monocytes Absolute 1.7 (*)     All other components within normal limits    Remi Haggard, NP 06/02/12 1512

## 2012-06-01 NOTE — ED Notes (Signed)
Per patient has a history of kidney stones-started having pain 4 hours ago on left side

## 2012-06-02 ENCOUNTER — Encounter (HOSPITAL_COMMUNITY): Payer: Self-pay

## 2012-06-02 DIAGNOSIS — R109 Unspecified abdominal pain: Secondary | ICD-10-CM | POA: Insufficient documentation

## 2012-06-02 DIAGNOSIS — N200 Calculus of kidney: Secondary | ICD-10-CM

## 2012-06-02 DIAGNOSIS — K630 Abscess of intestine: Secondary | ICD-10-CM

## 2012-06-02 DIAGNOSIS — D72829 Elevated white blood cell count, unspecified: Secondary | ICD-10-CM | POA: Insufficient documentation

## 2012-06-02 MED ORDER — SODIUM CHLORIDE 0.9 % IV SOLN
INTRAVENOUS | Status: DC
  Administered 2012-06-02 – 2012-06-04 (×3): via INTRAVENOUS

## 2012-06-02 MED ORDER — SODIUM CHLORIDE 0.9 % IJ SOLN
3.0000 mL | Freq: Two times a day (BID) | INTRAMUSCULAR | Status: DC
  Administered 2012-06-03 (×2): 3 mL via INTRAVENOUS

## 2012-06-02 NOTE — Progress Notes (Signed)
Subjective:  Patient alert, stable, denies pain. Passing flatus, no stool. Hungry.  Seen by Dr. Dahlstedt(Urology)  this morning regarding right ureteral stone.  Objective: Vital signs in last 24 hours: Temp:  [98 F (36.7 C)-99 F (37.2 C)] 98 F (36.7 C) (08/05 0600) Pulse Rate:  [74-80] 80  (08/05 0600) Resp:  [16-20] 20  (08/05 0600) BP: (135-167)/(86-109) 135/86 mmHg (08/05 0600) SpO2:  [94 %-98 %] 96 % (08/05 0600) Weight:  [219 lb 9.3 oz (99.6 kg)] 219 lb 9.3 oz (99.6 kg) (08/05 0053) Last BM Date: 06/01/12  Intake/Output from previous day: 08/04 0701 - 08/05 0700 In: -  Out: 400 [Urine:400] Intake/Output this shift:    General appearance: alert, no distress. mental status normal. GI: soft. mild LLQ tenderness. No mass. no guarding.  Lab Results:   Basename 06/01/12 1951 06/01/12 1934  WBC -- 21.0*  HGB 15.0 14.1  HCT 44.0 41.9  PLT -- 427*   BMET  Basename 06/01/12 1951  NA 141  K 3.6  CL 103  CO2 --  GLUCOSE 102*  BUN 15  CREATININE 1.30  CALCIUM --   PT/INR No results found for this basename: LABPROT:2,INR:2 in the last 72 hours ABG No results found for this basename: PHART:2,PCO2:2,PO2:2,HCO3:2 in the last 72 hours  Studies/Results: Ct Abdomen Pelvis Wo Contrast  06/01/2012  *RADIOLOGY REPORT*  Clinical Data: Flank pain, history of stone disease, low pelvic pain, prior splenectomy  CT ABDOMEN AND PELVIS WITHOUT CONTRAST  Technique:  Multidetector CT imaging of the abdomen and pelvis was performed following the standard protocol without intravenous contrast.  Comparison: 10/11/2010  Findings: Lung bases clear.  Normal heart size.  No pericardial or pleural effusion.  No hiatal hernia.  Abdomen:  Prior splenectomy noted.  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  Liver, gallbladder, biliary system, pancreas, adrenal glands, and right kidney demonstrate no acute process and are within normal limits for noncontrast study.  Left kidney  demonstrates acute perinephric inflammation with mild hydronephrosis hydroureter secondary to a proximal obstructing left ureteral 6 mm calculus, image 50.  No additional lower urinary tract obstructing calculus evident. Incidental punctate nonobstructing left lower pole renal calculus.  Additionally, left lower quadrant inflammatory changes are noted about the sigmoid colon.  Diverticular disease noted in this region.  Small extraluminal fluid collection noted anterior to the sigmoid colon measuring 24 x 16 mm, image 70.  This is compatible with acute diverticulitis and a small pericolonic abscess.  No associate obstruction pattern or ileus.  Normal appendix demonstrated.  Pelvis:  No distal pelvic ureteral obstruction or UVJ abnormality. Unremarkable bladder.  No pelvic free fluid, hemorrhage, adenopathy, inguinal abnormality, or hernia.  No acute osseous finding.  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  IMPRESSION: Acute obstructing proximal left ureteral 6 mm calculus with mild hydronephrosis and hydroureter.  Acute left lower quadrant sigmoid diverticulitis with an adjacent small 2 cm pericolonic abscess.  Original Report Authenticated By: Judie Petit. Ruel Favors, M.D.    Anti-infectives: Anti-infectives     Start     Dose/Rate Route Frequency Ordered Stop   06/02/12 0000   piperacillin-tazobactam (ZOSYN) IVPB 3.375 g  Status:  Discontinued        3.375 g 12.5 mL/hr over 240 Minutes Intravenous 4 times per day 06/01/12 2347 06/01/12 2350   06/02/12 0000  piperacillin-tazobactam (ZOSYN) IVPB 3.375 g       3.375 g 12.5 mL/hr over 240 Minutes  Intravenous Every 8 hours 06/01/12 2351     06/01/12 2145   ciprofloxacin (CIPRO) IVPB 400 mg        400 mg 200 mL/hr over 60 Minutes Intravenous  Once 06/01/12 2130 06/01/12 2313   06/01/12 2130   metroNIDAZOLE (FLAGYL) IVPB 500 mg        500 mg 100 mL/hr over 60 Minutes Intravenous   Once 06/01/12 2130 06/01/12 2312          Assessment/Plan:  Probable sigmoid diverticulitis with 2 cm abscess on CT scan. Interestingly, he has no GI symptoms and has no prior history of intestinal disease. Significant leukocytosis. It is advisable to treat with bowel rest and IV antibiotics, at least for another 24 hours. CBC tomorrow. Will allow clear liquids. Colonoscopy in 2 months or so.  Left mid ureteral calculus, recurrent. Medicine plan outlined by Dr. Retta Diones.    Hopefully this can be managed as an outpatient.   LOS: 1 day    Zayah Keilman M. Derrell Lolling, M.D., [facility] General and Minimally invasive Surgery Breast and Colorectal Surgery Office:   [phone] Pager:   [phone]  06/02/2012

## 2012-06-02 NOTE — Care Management Note (Signed)
    Page 1 of 1   06/04/2012     3:51:58 PM   CARE MANAGEMENT NOTE 06/04/2012  Patient:  Jeremiah Yates, Jeremiah Yates   Account Number:  [id number]  Date Initiated:  06/02/2012  Documentation initiated by:  Lanier Clam  Subjective/Objective Assessment:   ADMITTED W/ABD PAIN.     Action/Plan:   FROM HOME W/FAMILY. NO PCP.   Anticipated DC Date:  06/04/2012   Anticipated DC Plan:  HOME/SELF CARE      DC Planning Services  CM consult  Medication Assistance  [facility]      Choice offered to / List presented to:             Status of service:  Completed, signed off Medicare Important Message given?   (If response is "NO", the following Medicare IM given date fields will be blank) Date Medicare IM given:   Date Additional Medicare IM given:    Discharge Disposition:  HOME/SELF CARE  Per UR Regulation:  Reviewed for med. necessity/level of care/duration of stay  If discussed at Long Length of Stay Meetings, dates discussed:    Comments:  06/02/12 Tamla Winkels RN,BSN NCM [phone] PROVIDED W/PCP LISTING FOR MEDICAID PROVIDERS.GIVEN $4 [employer] MED LIST.QUALIFIES FOR INDIGENT FUNDS IF NEEDED.

## 2012-06-02 NOTE — Consult Note (Signed)
Urology Consult   Physician requesting consult: Pearson Grippe  Reason for consult: Kidney stone  History of Present Illness: Jeremiah Yates is a 36 y.o. admitted to the hospital last night with sudden onset of left flank pain and nausea. He does have a history of urolithiasis in the past. CT was performed because of the symptoms, revealing a 6 mm left ureteral stone. It also showed pericolonic abscess in the sigmoid area consistent with perforated diverticulitis. He's had no fever, and no prior history of diverticular disease. He is currently on IV antibiotics for the diverticulitis/perforation. Urologic consultation is requested because of his current history of left ureteral stone.  His last stone was in the right side, passing spontaneously couple of years ago.  He denies a history of voiding or storage urinary symptoms, hematuria, UTIs, STDs,  GU malignancy/trauma/surgery.  Past Medical History  Diagnosis Date  . Kidney stones   . Kidney stones     Past Surgical History  Procedure Date  . Splenectomy circa 1998  . Splenectomy 1998    Medications: Scheduled Meds:   . sodium chloride   Intravenous STAT  . ciprofloxacin  400 mg Intravenous Once  .  HYDROmorphone (DILAUDID) injection  1 mg Intravenous Once  . ketorolac  30 mg Intravenous Once  . metronidazole  500 mg Intravenous Once  . ondansetron  4 mg Intravenous Once  . piperacillin-tazobactam (ZOSYN)  IV  3.375 g Intravenous Q8H  . sodium chloride  3 mL Intravenous Q12H  . Tamsulosin HCl  0.4 mg Oral QHS  . DISCONTD: piperacillin-tazobactam (ZOSYN)  IV  3.375 g Intravenous Q6H   Continuous Infusions:   . sodium chloride 125 mL/hr at 06/02/12 0105   PRN Meds:.HYDROmorphone (DILAUDID) injection, ondansetron (ZOFRAN) IV, oxyCODONE-acetaminophen  Allergies: No Known Allergies  Family History  Problem Relation Age of Onset  . Lung cancer Father     alive at age 2  . Breast cancer Mother     alive at age 38    Social  History:  reports that he has never smoked. He has never used smokeless tobacco. He reports that he drinks about .5 ounces of alcohol per week. He reports that he does not use illicit drugs.  ROS: A complete review of systems was performed.  All systems are negative except for pertinent findings as noted.  Physical Exam:  Vital signs in last 24 hours: Temp:  [98 F (36.7 C)-99 F (37.2 C)] 98 F (36.7 C) (08/05 0600) Pulse Rate:  [74-80] 80  (08/05 0600) Resp:  [16-20] 20  (08/05 0600) BP: (135-167)/(86-109) 135/86 mmHg (08/05 0600) SpO2:  [94 %-98 %] 96 % (08/05 0600) Weight:  [99.6 kg (219 lb 9.3 oz)] 99.6 kg (219 lb 9.3 oz) (08/05 0053) General:  Alert and oriented, No acute distress HEENT: Normocephalic, atraumatic Neck: No JVD or lymphadenopathy Cardiovascular: Regular rate and rhythm Lungs: Clear bilaterally Abdomen: Soft, nontender, nondistended, no abdominal masses. There are no peritoneal signs Back: No CVA tenderness  Extremities: No edema Neurologic: Grossly intact  Laboratory Data:   Basename 06/01/12 1951 06/01/12 1934  WBC -- 21.0*  HGB 15.0 14.1  HCT 44.0 41.9  PLT -- 427*     Basename 06/01/12 1951  NA 141  K 3.6  CL 103  GLUCOSE 102*  BUN 15  CALCIUM --  CREATININE 1.30     Results for orders placed during the hospital encounter of 06/01/12 (from the past 24 hour(s))  URINALYSIS, ROUTINE W REFLEX MICROSCOPIC  Status: Abnormal   Collection Time   06/01/12  7:25 PM      Component Value Range   Color, Urine YELLOW  YELLOW   APPearance CLOUDY (*) CLEAR   Specific Gravity, Urine 1.024  1.005 - 1.030   pH 6.5  5.0 - 8.0   Glucose, UA NEGATIVE  NEGATIVE mg/dL   Hgb urine dipstick LARGE (*) NEGATIVE   Bilirubin Urine NEGATIVE  NEGATIVE   Ketones, ur NEGATIVE  NEGATIVE mg/dL   Protein, ur NEGATIVE  NEGATIVE mg/dL   Urobilinogen, UA 0.2  0.0 - 1.0 mg/dL   Nitrite NEGATIVE  NEGATIVE   Leukocytes, UA NEGATIVE  NEGATIVE  URINE MICROSCOPIC-ADD ON      Status: Abnormal   Collection Time   06/01/12  7:25 PM      Component Value Range   RBC / HPF 11-20  <3 RBC/hpf   Crystals CA OXALATE CRYSTALS (*) NEGATIVE   Urine-Other AMORPHOUS URATES/PHOSPHATES    CBC WITH DIFFERENTIAL     Status: Abnormal   Collection Time   06/01/12  7:34 PM      Component Value Range   WBC 21.0 (*) 4.0 - 10.5 K/uL   RBC 4.49  4.22 - 5.81 MIL/uL   Hemoglobin 14.1  13.0 - 17.0 g/dL   HCT 16.1  09.6 - 04.5 %   MCV 93.3  78.0 - 100.0 fL   MCH 31.4  26.0 - 34.0 pg   MCHC 33.7  30.0 - 36.0 g/dL   RDW 40.9  81.1 - 91.4 %   Platelets 427 (*) 150 - 400 K/uL   Neutrophils Relative 76  43 - 77 %   Lymphocytes Relative 15  12 - 46 %   Monocytes Relative 8  3 - 12 %   Eosinophils Relative 1  0 - 5 %   Basophils Relative 0  0 - 1 %   Neutro Abs 15.9 (*) 1.7 - 7.7 K/uL   Lymphs Abs 3.2  0.7 - 4.0 K/uL   Monocytes Absolute 1.7 (*) 0.1 - 1.0 K/uL   Eosinophils Absolute 0.2  0.0 - 0.7 K/uL   Basophils Absolute 0.0  0.0 - 0.1 K/uL   Smear Review MORPHOLOGY UNREMARKABLE    POCT I-STAT, CHEM 8     Status: Abnormal   Collection Time   06/01/12  7:51 PM      Component Value Range   Sodium 141  135 - 145 mEq/L   Potassium 3.6  3.5 - 5.1 mEq/L   Chloride 103  96 - 112 mEq/L   BUN 15  6 - 23 mg/dL   Creatinine, Ser 7.82  0.50 - 1.35 mg/dL   Glucose, Bld 956 (*) 70 - 99 mg/dL   Calcium, Ion 2.13  0.86 - 1.23 mmol/L   TCO2 25  0 - 100 mmol/L   Hemoglobin 15.0  13.0 - 17.0 g/dL   HCT 57.8  46.9 - 62.9 %   No results found for this or any previous visit (from the past 240 hour(s)).  Renal Function:  Basename 06/01/12 1951  CREATININE 1.30   Estimated Creatinine Clearance: 92.5 ml/min (by C-G formula based on Cr of 1.3).  Radiologic Imaging: Ct Abdomen Pelvis Wo Contrast  06/01/2012  *RADIOLOGY REPORT*  Clinical Data: Flank pain, history of stone disease, low pelvic pain, prior splenectomy  CT ABDOMEN AND PELVIS WITHOUT CONTRAST  Technique:  Multidetector CT imaging of  the abdomen and pelvis was performed following the standard protocol without intravenous  contrast.  Comparison: 10/11/2010  Findings: Lung bases clear.  Normal heart size.  No pericardial or pleural effusion.  No hiatal hernia.  Abdomen:  Prior splenectomy noted.  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  Liver, gallbladder, biliary system, pancreas, adrenal glands, and right kidney demonstrate no acute process and are within normal limits for noncontrast study.  Left kidney demonstrates acute perinephric inflammation with mild hydronephrosis hydroureter secondary to a proximal obstructing left ureteral 6 mm calculus, image 50.  No additional lower urinary tract obstructing calculus evident. Incidental punctate nonobstructing left lower pole renal calculus.  Additionally, left lower quadrant inflammatory changes are noted about the sigmoid colon.  Diverticular disease noted in this region.  Small extraluminal fluid collection noted anterior to the sigmoid colon measuring 24 x 16 mm, image 70.  This is compatible with acute diverticulitis and a small pericolonic abscess.  No associate obstruction pattern or ileus.  Normal appendix demonstrated.  Pelvis:  No distal pelvic ureteral obstruction or UVJ abnormality. Unremarkable bladder.  No pelvic free fluid, hemorrhage, adenopathy, inguinal abnormality, or hernia.  No acute osseous finding.  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  IMPRESSION: Acute obstructing proximal left ureteral 6 mm calculus with mild hydronephrosis and hydroureter.  Acute left lower quadrant sigmoid diverticulitis with an adjacent small 2 cm pericolonic abscess.  Original Report Authenticated By: Judie Petit. Ruel Favors, M.D.    I independently reviewed the above imaging studies.  Impression/Assessment:  1. 6 mm left proximal/mid ureteral calculus. He does have a prior history of urolithiasis. No doubt,  the stone precipitated his visit to the emergency room. There is no evidence of hydronephrosis i.e. his urinalysis is basically clear. There is evidence of leukocytosis, perhaps from his diverticulitis.  2. Left diverticular abscess. Currently, the patient is on antibiotics, but currently asymptomatic.  Plan:  1. As he is fairly comfortable, and there is no evidence of urinary tract infection, I feel comfortable treating his stone conservatively I will him pain medicine and alpha blockers  2. I have spoken with the patient and his wife. They feel comfortable following up eventually as an outpatient. We will contact him for followup appointment. He will Collis before that appointment if he has any complicating factors i.e. increasing fever, intractable pain.   Chelsea Aus 06/02/2012, 7:15 AM    Bertram Millard. Shannin Naab MD

## 2012-06-02 NOTE — Progress Notes (Signed)
[facility] PROGRESS NOTE  Jeremiah Yates:[id number] DOB: 21-Aug-1976 DOA: 06/01/2012 PCP: No primary provider on file.  Assessment/Plan: 1-Intestinal diverticular abscess:no abdominal pain, no N/V; afebrile, tolerating CLD. Will continue IV antibiotics, IVF's and supportive care. Will repeat CBC in am to follow WBC's. Per [facility] no surgery at this point needed. Colonoscopy as an outpatient in about 8 weeks.  2-Nephrolithiasis: w/o signs of UTI and no ureter obstruction or hydronephrosis. Per urology will attempt tx of his nephrolithiasis as an outpatient. Meanwhile will provide pain medication, IVF's, start flomax and continue Abx for problem #1, which will also provide coverage for any abnormality in his urine trac.  3-leukocytosis:2/2 #1 and also demargination. Continue abx's and IVF's; will recheck CBC in am.  DVT:SCD's   Code Status: Full Family Communication: wife and mother at bedside. Disposition Plan: Home when medically stable   Brief narrative: 36 y.o. admitted to the hospital last night with sudden onset of left flank pain and nausea. Patient with hx of nephrolithiasis in the past (passed stone spontaneously at that time). CT was performed because of the symptoms, revealing a 6 mm left ureteral stone. It also showed pericolonic abscess in the sigmoid area consistent with perforated diverticulitis.    Consultants:  [facility]  Urology  Antibiotics:  Zosyn  Received 1 dose of flagyl and ciprofloxacin in the ED (06/01/12)  HPI/Subjective: Afebrile; denies abdominal pain, nausea, vomiting or any other acute complaint. Patient endorses to be hungry and tolerated CLD.  Objective: Filed Vitals:   06/01/12 2306 06/02/12 0053 06/02/12 0600 06/02/12 1446  BP: 152/109 150/104 135/86 142/91  Pulse: 74 79 80 71  Temp: 98.7 F (37.1 C) 98.2 F (36.8 C) 98 F (36.7 C) 97.4 F (36.3 C)  TempSrc: Oral Oral Oral Oral  Resp: 16 20 20 20   Height:  5\' 8"  (1.727 m)    Weight:   99.6 kg (219 lb 9.3 oz)    SpO2: 94% 97% 96% 96%    Intake/Output Summary (Last 24 hours) at 06/02/12 2144 Last data filed at 06/02/12 1743  Gross per 24 hour  Intake   1000 ml  Output    800 ml  Net    200 ml    Exam:   General:  NAD  Cardiovascular: RRR; no murmurs  Respiratory: CTA  Abdomen: soft, NT/ND; positive BS  MSK:no joint swelling, no erythema  Neurologic: no focal deficit; CN intact  Data Reviewed: Basic Metabolic Panel:  Lab 06/01/12 1478  NA 141  K 3.6  CL 103  CO2 --  GLUCOSE 102*  BUN 15  CREATININE 1.30  CALCIUM --  MG --  PHOS --   CBC:  Lab 06/01/12 1951 06/01/12 1934  WBC -- 21.0*  NEUTROABS -- 15.9*  HGB 15.0 14.1  HCT 44.0 41.9  MCV -- 93.3  PLT -- 427*     Studies: Ct Abdomen Pelvis Wo Contrast  06/01/2012  *RADIOLOGY REPORT*  Clinical Data: Flank pain, history of stone disease, low pelvic pain, prior splenectomy  CT ABDOMEN AND PELVIS WITHOUT CONTRAST  Technique:  Multidetector CT imaging of the abdomen and pelvis was performed following the standard protocol without intravenous contrast.  Comparison: 10/11/2010  Findings: Lung bases clear.  Normal heart size.  No pericardial or pleural effusion.  No hiatal hernia.  Abdomen:  Prior splenectomy noted.  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  Liver, gallbladder, biliary system, pancreas, adrenal glands, and right kidney demonstrate no acute process and are within normal limits  for noncontrast study.  Left kidney demonstrates acute perinephric inflammation with mild hydronephrosis hydroureter secondary to a proximal obstructing left ureteral 6 mm calculus, image 50.  No additional lower urinary tract obstructing calculus evident. Incidental punctate nonobstructing left lower pole renal calculus.  Additionally, left lower quadrant inflammatory changes are noted about the sigmoid colon.  Diverticular disease noted in this region.  Small extraluminal fluid collection noted  anterior to the sigmoid colon measuring 24 x 16 mm, image 70.  This is compatible with acute diverticulitis and a small pericolonic abscess.  No associate obstruction pattern or ileus.  Normal appendix demonstrated.  Pelvis:  No distal pelvic ureteral obstruction or UVJ abnormality. Unremarkable bladder.  No pelvic free fluid, hemorrhage, adenopathy, inguinal abnormality, or hernia.  No acute osseous finding.  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  IMPRESSION: Acute obstructing proximal left ureteral 6 mm calculus with mild hydronephrosis and hydroureter.  Acute left lower quadrant sigmoid diverticulitis with an adjacent small 2 cm pericolonic abscess.  Original Report Authenticated By: Judie Petit. Ruel Favors, M.D.    Scheduled Meds:   . sodium chloride   Intravenous STAT  . ciprofloxacin  400 mg Intravenous Once  . metronidazole  500 mg Intravenous Once  . piperacillin-tazobactam (ZOSYN)  IV  3.375 g Intravenous Q8H  . sodium chloride  3 mL Intravenous Q12H  . Tamsulosin HCl  0.4 mg Oral QHS  . DISCONTD: piperacillin-tazobactam (ZOSYN)  IV  3.375 g Intravenous Q6H   Continuous Infusions:   . sodium chloride 125 mL/hr at 06/02/12 1500      Time spent: >30 minutes    Jeremiah Yates  [facility] Pager [phone]. If 8PM-8AM, please contact night-coverage at [email or web address], password [facility] 06/02/2012, 9:44 PM  LOS: 1 day

## 2012-06-03 DIAGNOSIS — D72829 Elevated white blood cell count, unspecified: Secondary | ICD-10-CM

## 2012-06-03 DIAGNOSIS — R109 Unspecified abdominal pain: Secondary | ICD-10-CM

## 2012-06-03 LAB — BASIC METABOLIC PANEL
BUN: 8 mg/dL (ref 6–23)
CO2: 27 mEq/L (ref 19–32)
Chloride: 104 mEq/L (ref 96–112)
Creatinine, Ser: 1.42 mg/dL — ABNORMAL HIGH (ref 0.50–1.35)
GFR calc Af Amer: 74 mL/min — ABNORMAL LOW (ref >90)
Potassium: 3.1 mEq/L — ABNORMAL LOW (ref 3.5–5.1)

## 2012-06-03 LAB — CBC
HCT: 36.5 % — ABNORMAL LOW (ref 39.0–52.0)
Hemoglobin: 12.5 g/dL — ABNORMAL LOW (ref 13.0–17.0)
MCV: 92.6 fL (ref 78.0–100.0)
RBC: 3.94 MIL/uL — ABNORMAL LOW (ref 4.22–5.81)
RDW: 14.2 % (ref 11.5–15.5)
WBC: 12.5 10*3/uL — ABNORMAL HIGH (ref 4.0–10.5)

## 2012-06-03 MED ORDER — POTASSIUM CHLORIDE CRYS ER 20 MEQ PO TBCR
40.0000 meq | EXTENDED_RELEASE_TABLET | ORAL | Status: AC
  Administered 2012-06-03 (×3): 40 meq via ORAL
  Filled 2012-06-03 (×3): qty 2

## 2012-06-03 NOTE — Progress Notes (Signed)
  Subjective: Had some transient left flank pain last might be this has resolved. He says this is typical for kidney stone. He denies anterior abdominal pain or nausea. Passing lots of flatus. Tolerating clear liquid diet. States he is very hungry.  WBC down to 12,500. Afebrile. Heart rate 80.  Objective: Vital signs in last 24 hours: Temp:  [97.4 F (36.3 C)-97.7 F (36.5 C)] 97.7 F (36.5 C) (08/06 0625) Pulse Rate:  [71-83] 80  (08/06 0625) Resp:  [18-20] 18  (08/06 0625) BP: (121-154)/(77-104) 121/79 mmHg (08/06 0625) SpO2:  [96 %-97 %] 97 % (08/06 0625) Last BM Date: 06/01/12  Intake/Output from previous day: 08/05 0701 - 08/06 0700 In: 2050 [I.V.:2000; IV Piggyback:50] Out: 1910 [Urine:1910] Intake/Output this shift:    General appearance: alert. Appropriate. Status normal. No distress. GI: abdomen soft. Nondistended. Still a little bit uncomfortable to palpate left lower quadrant. No mass.  Lab Results:  Results for orders placed during the hospital encounter of 06/01/12 (from the past 24 hour(s))  CBC     Status: Abnormal   Collection Time   06/03/12  4:13 AM      Component Value Range   WBC 12.5 (*) 4.0 - 10.5 K/uL   RBC 3.94 (*) 4.22 - 5.81 MIL/uL   Hemoglobin 12.5 (*) 13.0 - 17.0 g/dL   HCT 16.1 (*) 09.6 - 04.5 %   MCV 92.6  78.0 - 100.0 fL   MCH 31.7  26.0 - 34.0 pg   MCHC 34.2  30.0 - 36.0 g/dL   RDW 40.9  81.1 - 91.4 %   Platelets 357  150 - 400 K/uL  BASIC METABOLIC PANEL     Status: Abnormal   Collection Time   06/03/12  4:13 AM      Component Value Range   Sodium 140  135 - 145 mEq/L   Potassium 3.1 (*) 3.5 - 5.1 mEq/L   Chloride 104  96 - 112 mEq/L   CO2 27  19 - 32 mEq/L   Glucose, Bld 101 (*) 70 - 99 mg/dL   BUN 8  6 - 23 mg/dL   Creatinine, Ser 7.82 (*) 0.50 - 1.35 mg/dL   Calcium 8.1 (*) 8.4 - 10.5 mg/dL   GFR calc non Af Amer 64 (*) >90 mL/min   GFR calc Af Amer 74 (*) >90 mL/min     Studies/Results: @RISRSLT24 @     . sodium  chloride   Intravenous STAT  . piperacillin-tazobactam (ZOSYN)  IV  3.375 g Intravenous Q8H  . sodium chloride  3 mL Intravenous Q12H  . Tamsulosin HCl  0.4 mg Oral QHS     Assessment/Plan:   Probable sigmoid diverticulitis with 2 cm abscess on CT scan. Interestingly, he has no GI symptoms and has no prior history of intestinal disease.   Significant leukocytosis, resolving  Advance diet CBC tomorrow.  Continue parenteral antibiotics  Colonoscopy in 2 months or so.   Left mid ureteral calculus, recurrent. Medicine plan outlined by Dr. Retta Diones. Hopefully this can be managed as an outpatient. Question whether he may have had episode of renal colic last night. This was self-limited.        LOS: 2 days    Ahmaud Duthie M. Derrell Lolling, M.D., [facility] General and Minimally invasive Surgery Breast and Colorectal Surgery Office:   [phone] Pager:   [phone]  06/03/2012  . .prob

## 2012-06-03 NOTE — Progress Notes (Signed)
[facility] PROGRESS NOTE  KOLTON KIENLE ZOX:[id number] DOB: 07-20-1976 DOA: 06/01/2012 PCP: No primary provider on file.  Assessment/Plan: 1-Intestinal diverticular abscess: mild LLQ pain abdominal pain, no N/V; afebrile, tolerating diet. Will continue IV antibiotics, IVF's and supportive care. Will follow WBC's trend and will follow CCS rec's; so far no indication for surgery at this point. Colonoscopy as an outpatient in about 8 weeks.  2-Nephrolithiasis: w/o signs of UTI and no ureter obstruction or hydronephrosis. Per urology will attempt tx of his nephrolithiasis as an outpatient. Meanwhile will continue pain medication, IVF's, flomax and continue Abx for problem #1, which will also provide coverage for any abnormality in his urinary tract.  3-leukocytosis: 2/2 #1 and also demargination. Continue abx's and IVF's; WBC's trending down. Will recheck CBC in am.  4-ARF: could be associated with nephrolithiasis, antibiotics or just dehydration prior to admission that is reflecting now. Will continue IVF's, repeat BMET in am and if his Cr continue to be elevated will need renal US.  DVT:SCD's   Code Status: Full Family Communication: wife and mother at bedside. Disposition Plan: Home when medically stable   Brief narrative: 36 y.o. admitted to the hospital last night with sudden onset of left flank pain and nausea. Patient with hx of nephrolithiasis in the past (passed stone spontaneously at that time). CT was performed because of the symptoms, revealing a 6 mm left ureteral stone. It also showed pericolonic abscess in the sigmoid area consistent with perforated diverticulitis.    Consultants:  [facility]  Urology  Antibiotics:  Zosyn  Received 1 dose of flagyl and ciprofloxacin in the ED (06/01/12)  HPI/Subjective: Afebrile; denies, nausea or vomiting. Patient overnight with flare of left flank pain that went away on his own (possibly renal colic from nephrolithiasis). Patient  endorses to be hungry and is tolerating regular diet.  Objective: Filed Vitals:   06/02/12 2145 06/03/12 0039 06/03/12 0625 06/03/12 1500  BP: 154/104 127/77 121/79 145/88  Pulse: 80 83 80 95  Temp: 97.7 F (36.5 C)  97.7 F (36.5 C) 97.7 F (36.5 C)  TempSrc: Oral  Oral Oral  Resp: 20  18 18   Height:      Weight:      SpO2: 96%  97% 97%    Intake/Output Summary (Last 24 hours) at 06/03/12 1619 Last data filed at 06/03/12 1555  Gross per 24 hour  Intake   1550 ml  Output   1910 ml  Net   -360 ml    Exam:   General:  NAD  Cardiovascular: RRR; no murmurs  Respiratory: CTA  Abdomen: soft, sore LLQ on palpation; ND; positive BS  MSK:no joint swelling, no erythema  Neurologic: no focal deficit; CN intact  Data Reviewed: Basic Metabolic Panel:  Lab 06/03/12 8119 06/01/12 1951  NA 140 141  K 3.1* 3.6  CL 104 103  CO2 27 --  GLUCOSE 101* 102*  BUN 8 15  CREATININE 1.42* 1.30  CALCIUM 8.1* --  MG -- --  PHOS -- --   CBC:  Lab 06/03/12 0413 06/01/12 1951 06/01/12 1934  WBC 12.5* -- 21.0*  NEUTROABS -- -- 15.9*  HGB 12.5* 15.0 14.1  HCT 36.5* 44.0 41.9  MCV 92.6 -- 93.3  PLT 357 -- 427*     Studies: Ct Abdomen Pelvis Wo Contrast  06/01/2012  *RADIOLOGY REPORT*  Clinical Data: Flank pain, history of stone disease, low pelvic pain, prior splenectomy  CT ABDOMEN AND PELVIS WITHOUT CONTRAST  Technique:  Multidetector CT  imaging of the abdomen and pelvis was performed following the standard protocol without intravenous contrast.  Comparison: 10/11/2010  Findings: Lung bases clear.  Normal heart size.  No pericardial or pleural effusion.  No hiatal hernia.  Abdomen:  Prior splenectomy noted.  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  Liver, gallbladder, biliary system, pancreas, adrenal glands, and right kidney demonstrate no acute process and are within normal limits for noncontrast study.  Left kidney demonstrates acute perinephric inflammation  with mild hydronephrosis hydroureter secondary to a proximal obstructing left ureteral 6 mm calculus, image 50.  No additional lower urinary tract obstructing calculus evident. Incidental punctate nonobstructing left lower pole renal calculus.  Additionally, left lower quadrant inflammatory changes are noted about the sigmoid colon.  Diverticular disease noted in this region.  Small extraluminal fluid collection noted anterior to the sigmoid colon measuring 24 x 16 mm, image 70.  This is compatible with acute diverticulitis and a small pericolonic abscess.  No associate obstruction pattern or ileus.  Normal appendix demonstrated.  Pelvis:  No distal pelvic ureteral obstruction or UVJ abnormality. Unremarkable bladder.  No pelvic free fluid, hemorrhage, adenopathy, inguinal abnormality, or hernia.  No acute osseous finding.  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  IMPRESSION: Acute obstructing proximal left ureteral 6 mm calculus with mild hydronephrosis and hydroureter.  Acute left lower quadrant sigmoid diverticulitis with an adjacent small 2 cm pericolonic abscess.  Original Report Authenticated By: Judie Petit. Ruel Favors, M.D.    Scheduled Meds:    . sodium chloride   Intravenous STAT  . piperacillin-tazobactam (ZOSYN)  IV  3.375 g Intravenous Q8H  . potassium chloride  40 mEq Oral Q4H  . sodium chloride  3 mL Intravenous Q12H  . Tamsulosin HCl  0.4 mg Oral QHS   Continuous Infusions:    . sodium chloride 125 mL/hr at 06/02/12 2308      Time spent: >30 minutes    Noelle Sease  [facility] Pager [phone]. If 8PM-8AM, please contact night-coverage at [email or web address], password [facility] 06/03/2012, 4:19 PM  LOS: 2 days

## 2012-06-04 DIAGNOSIS — N179 Acute kidney failure, unspecified: Secondary | ICD-10-CM

## 2012-06-04 LAB — BASIC METABOLIC PANEL
BUN: 7 mg/dL (ref 6–23)
Chloride: 103 mEq/L (ref 96–112)
Creatinine, Ser: 1.19 mg/dL (ref 0.50–1.35)
GFR calc non Af Amer: 79 mL/min — ABNORMAL LOW (ref >90)
Glucose, Bld: 99 mg/dL (ref 70–99)
Potassium: 3.5 mEq/L (ref 3.5–5.1)

## 2012-06-04 LAB — CBC
HCT: 38.4 % — ABNORMAL LOW (ref 39.0–52.0)
Hemoglobin: 12.9 g/dL — ABNORMAL LOW (ref 13.0–17.0)
MCHC: 33.6 g/dL (ref 30.0–36.0)
MCV: 92.8 fL (ref 78.0–100.0)

## 2012-06-04 MED ORDER — METRONIDAZOLE 500 MG PO TABS: 500.0000 mg | ORAL_TABLET | Freq: Four times a day (QID) | ORAL | Status: AC

## 2012-06-04 MED ORDER — TAMSULOSIN HCL 0.4 MG PO CAPS: 0.4000 mg | ORAL_CAPSULE | Freq: Every day | ORAL | Status: DC

## 2012-06-04 MED ORDER — OXYCODONE-ACETAMINOPHEN 5-325 MG PO TABS: 1.0000 | ORAL_TABLET | ORAL | Status: AC | PRN

## 2012-06-04 MED ORDER — OXYCODONE-ACETAMINOPHEN 5-325 MG PO TABS: 2.0000 | ORAL_TABLET | ORAL | Status: DC | PRN

## 2012-06-04 MED ORDER — CIPROFLOXACIN HCL 500 MG PO TABS
500.0000 mg | ORAL_TABLET | Freq: Two times a day (BID) | ORAL | Status: DC
  Filled 2012-06-04 (×3): qty 1

## 2012-06-04 MED ORDER — CIPROFLOXACIN HCL 500 MG PO TABS: 500.0000 mg | ORAL_TABLET | Freq: Two times a day (BID) | ORAL | Status: AC

## 2012-06-04 MED ORDER — METRONIDAZOLE 500 MG PO TABS
500.0000 mg | ORAL_TABLET | Freq: Four times a day (QID) | ORAL | Status: DC
  Filled 2012-06-04 (×3): qty 1

## 2012-06-04 NOTE — Discharge Summary (Signed)
Physician Discharge Summary  Jeremiah Yates:[id number] DOB: 1976-10-19 DOA: 06/01/2012  PCP: No primary provider on file. General surgeon: H. Derrell Lolling, MD Urologist: Jeremiah Rhodes. Dahlstedt, MD  Admit date: 06/01/2012 Discharge date: 06/04/2012  Recommendations for Outpatient Follow-up:  1. Follow-up resolution of asymptomatic diverticulitis. 2. Consider outpatient colonoscopy 3. Follow-up kidney stone   Follow-up Information    Follow up with Jeremiah Mention, MD. Schedule an appointment as soon as possible for a visit in 3 weeks.   Contact information:   [address] [place] [address] [phone]       Schedule an appointment as soon as possible for a visit with Yates, Bertram Millard, MD.   Contact information:   [address] [place] [address] [phone]         Discharge Diagnoses:  1. Probable sigmoid diverticulitis with 2 cm abscess 2. Left distal ureteral stone 3. Acute renal failure  Discharge Condition: improved Disposition: home  Diet recommendation: low residue diet  History of present illness:  36 year old man presented with left flank pain--CT showed diverticular abscess and nephrolithiasis.  Hospital Course:  Jeremiah Yates was admitted for treatment of diverticular abscess, surgery and urology consultation. His flank pain resolved and he will follow-up with urology as an outpatient and continue on Flomax. He will follow-up with general surgery as an outpatient and complete antibiotics as an outpatient. 1. Probable sigmoid diverticulitis with 2 cm abscess: Clear for discharge per general surgery. Continue Cipro and Flagyl for 10 days. Low residue diet. Follow-up with Dr. Derrell Yates in 1 month. He will arrange colonoscopy in 2 months.  2. Left distal ureteral stone: Continue tamsulosin, pain medication. Follow-up with Jeremiah Yates as directed.  3. Acute renal failure: Resolved.  Consultants:  General surgery    Urology  Procedures:  none  Antibiotics:  Cipro 8/7>>8/16   Flagyl 8/7>>8/16  Discharge Instructions  Discharge Orders    Future Orders Please Complete By Expires   Increase activity slowly      Discharge instructions      Comments:   Be sure to finish antibiotics. Follow-up with general surgery and urology as directed. Seek medical attention for fever, abdominal pain. Low residue diet recommended.     Medication List  As of 06/04/2012  3:45 PM   TAKE these medications         ciprofloxacin 500 MG tablet   Commonly known as: CIPRO   Take 1 tablet (500 mg total) by mouth 2 (two) times daily.      metroNIDAZOLE 500 MG tablet   Commonly known as: FLAGYL   Take 1 tablet (500 mg total) by mouth every 6 (six) hours.      oxyCODONE-acetaminophen 5-325 MG per tablet   Commonly known as: PERCOCET/ROXICET   Take 2 tablets by mouth every 4 (four) hours as needed for pain.      Tamsulosin HCl 0.4 MG Caps   Commonly known as: FLOMAX   Take 1 capsule (0.4 mg total) by mouth daily.            The results of significant diagnostics from this hospitalization (including imaging, microbiology, ancillary and laboratory) are listed below for reference.    Significant Diagnostic Studies: Ct Abdomen Pelvis Wo Contrast  06/01/2012  *RADIOLOGY REPORT*  Clinical Data: Flank pain, history of stone disease, low pelvic pain, prior splenectomy  CT ABDOMEN AND PELVIS WITHOUT CONTRAST  Technique:  Multidetector CT imaging of the abdomen and pelvis was  performed following the standard protocol without intravenous contrast.  Comparison: 10/11/2010  Findings: Lung bases clear.  Normal heart size.  No pericardial or pleural effusion.  No hiatal hernia.  Abdomen:  Prior splenectomy noted.  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  Liver, gallbladder, biliary system, pancreas, adrenal glands, and right kidney demonstrate no acute process and are within normal limits for noncontrast  study.  Left kidney demonstrates acute perinephric inflammation with mild hydronephrosis hydroureter secondary to a proximal obstructing left ureteral 6 mm calculus, image 50.  No additional lower urinary tract obstructing calculus evident. Incidental punctate nonobstructing left lower pole renal calculus.  Additionally, left lower quadrant inflammatory changes are noted about the sigmoid colon.  Diverticular disease noted in this region.  Small extraluminal fluid collection noted anterior to the sigmoid colon measuring 24 x 16 mm, image 70.  This is compatible with acute diverticulitis and a small pericolonic abscess.  No associate obstruction pattern or ileus.  Normal appendix demonstrated.  Pelvis:  No distal pelvic ureteral obstruction or UVJ abnormality. Unremarkable bladder.  No pelvic free fluid, hemorrhage, adenopathy, inguinal abnormality, or hernia.  No acute osseous finding.  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  IMPRESSION: Acute obstructing proximal left ureteral 6 mm calculus with mild hydronephrosis and hydroureter.  Acute left lower quadrant sigmoid diverticulitis with an adjacent small 2 cm pericolonic abscess.  Original Report Authenticated By: Jeremiah Yates, M.D.   Labs: Basic Metabolic Panel:  Lab 06/04/12 6213 06/03/12 0413 06/01/12 1951  NA 139 140 141  K 3.5 3.1* 3.6  CL 103 104 103  CO2 26 27 --  GLUCOSE 99 101* 102*  BUN 7 8 15   CREATININE 1.19 1.42* 1.30  CALCIUM 8.7 8.1* --  MG -- -- --  PHOS -- -- --   CBC:  Lab 06/04/12 0428 06/03/12 0413 06/01/12 1951 06/01/12 1934  WBC 10.5 12.5* -- 21.0*  NEUTROABS -- -- -- 15.9*  HGB 12.9* 12.5* 15.0 14.1  HCT 38.4* 36.5* 44.0 41.9  MCV 92.8 92.6 -- 93.3  PLT 351 357 -- 427*    Principal Problem:  *Nephrolithiasis Active Problems:  Intestinal diverticular abscess  Acute renal failure   Time coordinating discharge: 25  minutes  Signed:  Brendia Sacks, MD [facility] 06/04/2012, 3:45 PM

## 2012-06-04 NOTE — Progress Notes (Signed)
  Subjective: Feels good. Three very soft stools without blood. Tolerating low residue diet.  WBC 10, 500.  Objective: Vital signs in last 24 hours: Temp:  [97.7 F (36.5 C)-98.6 F (37 C)] 98.6 F (37 C) (08/07 0549) Pulse Rate:  [67-95] 67  (08/07 0549) Resp:  [18-20] 18  (08/07 0549) BP: (112-152)/(71-113) 112/71 mmHg (08/07 0549) SpO2:  [94 %-97 %] 94 % (08/07 0549) Last BM Date: 06/03/12  Intake/Output from previous day: 08/06 0701 - 08/07 0700 In: 1412.5 [I.V.:1262.5; IV Piggyback:150] Out: 876 [Urine:875; Stool:1] Intake/Output this shift:    General appearance: alert. Appropriate. No distress. GI: abdomen soft. Mild subjective LLQ tenderness. No mass. No guarding.  Lab Results:   Basename 06/04/12 0428 06/03/12 0413  WBC 10.5 12.5*  HGB 12.9* 12.5*  HCT 38.4* 36.5*  PLT 351 357   BMET  Basename 06/04/12 0428 06/03/12 0413  NA 139 140  K 3.5 3.1*  CL 103 104  CO2 26 27  GLUCOSE 99 101*  BUN 7 8  CREATININE 1.19 1.42*  CALCIUM 8.7 8.1*   PT/INR No results found for this basename: LABPROT:2,INR:2 in the last 72 hours ABG No results found for this basename: PHART:2,PCO2:2,PO2:2,HCO3:2 in the last 72 hours  Studies/Results: No results found.  Anti-infectives: Anti-infectives     Start     Dose/Rate Route Frequency Ordered Stop   06/02/12 0000   piperacillin-tazobactam (ZOSYN) IVPB 3.375 g  Status:  Discontinued        3.375 g 12.5 mL/hr over 240 Minutes Intravenous 4 times per day 06/01/12 2347 06/01/12 2350   06/02/12 0000  piperacillin-tazobactam (ZOSYN) IVPB 3.375 g       3.375 g 12.5 mL/hr over 240 Minutes Intravenous Every 8 hours 06/01/12 2351     06/01/12 2145   ciprofloxacin (CIPRO) IVPB 400 mg        400 mg 200 mL/hr over 60 Minutes Intravenous  Once 06/01/12 2130 06/01/12 2313   06/01/12 2130   metroNIDAZOLE (FLAGYL) IVPB 500 mg        500 mg 100 mL/hr over 60 Minutes Intravenous  Once 06/01/12 2130 06/01/12 2312           Assessment/Plan:  Probable sigmoid diverticulitis with 2 cm abscess on CT scan. Interestingly, he has no GI symptoms and has no prior history of intestinal disease.  Significant leukocytosis, resolved   OK to discharge. Recommend low resdidue diet Recommend P.O. Cipro and Flagyl for 10 days Return to see me in 1 month. I will arrange colonoscopy at 2 months.   Left mid ureteral calculus, recurrent. Medical  plan outlined by Dr. Retta Diones. Hopefully this can be managed as an outpatient.        LOS: 3 days    Eula Mazzola M. Derrell Lolling, M.D., [facility] General and Minimally invasive Surgery Breast and Colorectal Surgery Office:   [phone] Pager:   [phone]  06/04/2012

## 2012-06-04 NOTE — ED Provider Notes (Signed)
Medical screening examination/treatment/procedure(s) were performed by non-physician practitioner and as supervising physician I was immediately available for consultation/collaboration.  Juliet Rude. Rubin Payor, MD 06/04/12 [phone]

## 2012-06-04 NOTE — ED Provider Notes (Signed)
Medical screening examination/treatment/procedure(s) were performed by non-physician practitioner and as supervising physician I was immediately available for consultation/collaboration.  Juliet Rude. Rubin Payor, MD 06/04/12 [phone]

## 2012-06-04 NOTE — Progress Notes (Signed)
[facility] PROGRESS NOTE  Jeremiah Yates ZOX:[id number] DOB: [birth year] DOA: 06/01/2012 PCP: No primary provider on file. None General surgeon: H. Derrell Lolling, MD Urologist: Kathie Rhodes. Dahlstedt, MD  Assessment/Plan: 1. Probable sigmoid diverticulitis with 2 cm abscess: Clear for discharge per general surgery. Continue Cipro and Flagyl for 10 days. Low residue diet. Follow-up with Dr. Derrell Lolling in 1 month. He will arrange colonoscopy in 2 months. 2. Left distal ureteral stone: Continue tamsulosin, pain medication. Follow-up with Dr. Retta Diones as directed. 3. Acute renal failure: Resolved.  Code Status: Full code  Family Communication: Milinda Antis [phone] (partner)  Disposition Plan: Home after stable  Brendia Sacks, MD  [facility] Team 4 Pager [phone]. If 8PM-8AM, please contact night-coverage at [email or web address], password [facility] 06/04/2012, 3:01 PM  LOS: 3 days   Brief narrative: 36 year old man presented with left flank pain--CT showed diverticular abscess and   Consultants:  General surgery  Urology  Procedures:  none  Antibiotics:  Cipro 8/7>>8/16  Flagyl 8/7>>8/16  HPI/Subjective: Feels fine. No pain. Eating well.  Objective: Filed Vitals:   06/03/12 1500 06/03/12 2109 06/04/12 0257 06/04/12 0549  BP: 145/88 152/113 136/86 112/71  Pulse: 95 80  67  Temp: 97.7 F (36.5 C) 98.2 F (36.8 C)  98.6 F (37 C)  TempSrc: Oral Oral  Oral  Resp: 18 20  18   Height:      Weight:      SpO2: 97% 96%  94%    Intake/Output Summary (Last 24 hours) at 06/04/12 1501 Last data filed at 06/04/12 1358  Gross per 24 hour  Intake 2059.17 ml  Output    876 ml  Net 1183.17 ml   Wt Readings from Last 3 Encounters:  06/02/12 99.6 kg (219 lb 9.3 oz)    Exam:   General:  Appears calm and comfortable. Speech fluent and clear.  Cardiovascular: RRR, no m/r/g. No LE edeman.  Respiratory: CTA bilaterally, no w/r/r. Normal respiratory effort.  Abdomen: soft, nt,  nd  Data Reviewed: Basic Metabolic Panel:  Lab 06/04/12 6578 06/03/12 0413 06/01/12 1951  NA 139 140 141  K 3.5 3.1* 3.6  CL 103 104 103  CO2 26 27 --  GLUCOSE 99 101* 102*  BUN 7 8 15   CREATININE 1.19 1.42* 1.30  CALCIUM 8.7 8.1* --  MG -- -- --  PHOS -- -- --   CBC:  Lab 06/04/12 0428 06/03/12 0413 06/01/12 1951 06/01/12 1934  WBC 10.5 12.5* -- 21.0*  NEUTROABS -- -- -- 15.9*  HGB 12.9* 12.5* 15.0 14.1  HCT 38.4* 36.5* 44.0 41.9  MCV 92.8 92.6 -- 93.3  PLT 351 357 -- 427*   Studies: Ct Abdomen Pelvis Wo Contrast  06/01/2012  *RADIOLOGY REPORT*  IMPRESSION: Acute obstructing proximal left ureteral 6 mm calculus with mild hydronephrosis and hydroureter.  Acute left lower quadrant sigmoid diverticulitis with an adjacent small 2 cm pericolonic abscess.  Original Report Authenticated By: Judie Petit. Ruel Favors, M.D.   Scheduled Meds:   . piperacillin-tazobactam (ZOSYN)  IV  3.375 g Intravenous Q8H  . potassium chloride  40 mEq Oral Q4H  . sodium chloride  3 mL Intravenous Q12H  . Tamsulosin HCl  0.4 mg Oral QHS   Continuous Infusions:   . sodium chloride 100 mL/hr at 06/04/12 1316    Principal Problem:  *Nephrolithiasis Active Problems:  Intestinal diverticular abscess  Acute renal failure     Brendia Sacks, MD  [facility] Team 4 Pager [phone]. If 8PM-8AM, please contact night-coverage at  [email or web address], password [facility] 06/04/2012, 3:01 PM  LOS: 3 days

## 2012-06-04 NOTE — Progress Notes (Signed)
  Subjective: Patient reports less abdominal pain. He is tolerating a regular diet Objective: Vital signs in last 24 hours: Temp:  [97.7 F (36.5 C)-98.6 F (37 C)] 98.6 F (37 C) (08/07 0549) Pulse Rate:  [67-95] 67  (08/07 0549) Resp:  [18-20] 18  (08/07 0549) BP: (112-152)/(71-113) 112/71 mmHg (08/07 0549) SpO2:  [94 %-97 %] 94 % (08/07 0549)  Intake/Output from previous day: 08/06 0701 - 08/07 0700 In: 1412.5 [I.V.:1262.5; IV Piggyback:150] Out: 876 [Urine:875; Stool:1] Intake/Output this shift:    Physical Exam:  Constitutional: Vital signs reviewed. WD WN in NAD   Abdomen: No CVA tenderness  Lab Results:  Basename 06/04/12 0428 06/03/12 0413 06/01/12 1951  HGB 12.9* 12.5* 15.0  HCT 38.4* 36.5* 44.0   BMET  Basename 06/04/12 0428 06/03/12 0413  NA 139 140  K 3.5 3.1*  CL 103 104  CO2 26 27  GLUCOSE 99 101*  BUN 7 8  CREATININE 1.19 1.42*  CALCIUM 8.7 8.1*   No results found for this basename: LABPT:3,INR:3 in the last 72 hours No results found for this basename: LABURIN:1 in the last 72 hours No results found for this or any previous visit.  Studies/Results: No results found.  Assessment/Plan:   Left distal ureteral stone. The patient is fairly comfortable. Regarding his diverticular abscess, he is improving.  I would recommend letting him go at the appropriate time on a pain medicine as well as tamsulosin. He knows to call my office to set up a followup appointment. I will sign off at this point, call me if further urologic followup is needed in the hospital.   LOS: 3 days   Marcine Matar M 06/04/2012, 8:13 AM

## 2020-10-13 ENCOUNTER — Other Ambulatory Visit: Payer: Self-pay

## 2020-10-13 ENCOUNTER — Emergency Department (HOSPITAL_COMMUNITY)
Admission: EM | Admit: 2020-10-13 | Discharge: 2020-10-13 | Disposition: A | Discharge status: Home / Self Care | Attending: Emergency Medicine | Admitting: Emergency Medicine

## 2020-10-13 ENCOUNTER — Emergency Department (HOSPITAL_COMMUNITY)

## 2020-10-13 ENCOUNTER — Encounter (HOSPITAL_COMMUNITY): Payer: Self-pay | Admitting: *Deleted

## 2020-10-13 DIAGNOSIS — J3489 Other specified disorders of nose and nasal sinuses: Secondary | ICD-10-CM | POA: Insufficient documentation

## 2020-10-13 DIAGNOSIS — Z20822 Contact with and (suspected) exposure to covid-19: Secondary | ICD-10-CM | POA: Diagnosis not present

## 2020-10-13 DIAGNOSIS — R0602 Shortness of breath: Secondary | ICD-10-CM | POA: Diagnosis not present

## 2020-10-13 DIAGNOSIS — I10 Essential (primary) hypertension: Secondary | ICD-10-CM | POA: Insufficient documentation

## 2020-10-13 DIAGNOSIS — R0981 Nasal congestion: Secondary | ICD-10-CM | POA: Diagnosis not present

## 2020-10-13 DIAGNOSIS — R062 Wheezing: Secondary | ICD-10-CM | POA: Insufficient documentation

## 2020-10-13 DIAGNOSIS — R059 Cough, unspecified: Secondary | ICD-10-CM | POA: Insufficient documentation

## 2020-10-13 LAB — CBC WITH DIFFERENTIAL/PLATELET
Abs Immature Granulocytes: 0.04 10*3/uL (ref 0.00–0.07)
Basophils Absolute: 0 10*3/uL (ref 0.0–0.1)
Basophils Relative: 1 %
Eosinophils Absolute: 0 10*3/uL (ref 0.0–0.5)
Eosinophils Relative: 0 %
HCT: 49.7 % (ref 39.0–52.0)
Hemoglobin: 16.2 g/dL (ref 13.0–17.0)
Immature Granulocytes: 1 %
Lymphocytes Relative: 17 %
Lymphs Abs: 1.4 10*3/uL (ref 0.7–4.0)
MCH: 30.7 pg (ref 26.0–34.0)
MCHC: 32.6 g/dL (ref 30.0–36.0)
MCV: 94.3 fL (ref 80.0–100.0)
Monocytes Absolute: 0.1 10*3/uL (ref 0.1–1.0)
Monocytes Relative: 1 %
Neutro Abs: 6.7 10*3/uL (ref 1.7–7.7)
Neutrophils Relative %: 80 %
Platelets: 463 10*3/uL — ABNORMAL HIGH (ref 150–400)
RBC: 5.27 MIL/uL (ref 4.22–5.81)
RDW: 14.3 % (ref 11.5–15.5)
WBC: 8.3 10*3/uL (ref 4.0–10.5)
nRBC: 0 % (ref 0.0–0.2)

## 2020-10-13 LAB — RESP PANEL BY RT-PCR (FLU A&B, COVID) ARPGX2
Influenza A by PCR: NEGATIVE
Influenza B by PCR: NEGATIVE
SARS Coronavirus 2 by RT PCR: NEGATIVE

## 2020-10-13 LAB — BASIC METABOLIC PANEL
Anion gap: 13 (ref 5–15)
BUN: 21 mg/dL — ABNORMAL HIGH (ref 6–20)
CO2: 26 mmol/L (ref 22–32)
Calcium: 9.6 mg/dL (ref 8.9–10.3)
Chloride: 98 mmol/L (ref 98–111)
Creatinine, Ser: 1.07 mg/dL (ref 0.61–1.24)
GFR, Estimated: >60 mL/min (ref >60)
Glucose, Bld: 177 mg/dL — ABNORMAL HIGH (ref 70–99)
Potassium: 3.7 mmol/L (ref 3.5–5.1)
Sodium: 137 mmol/L (ref 135–145)

## 2020-10-13 LAB — D-DIMER, QUANTITATIVE: D-Dimer, Quant: <0.27 ug/mL-FEU (ref 0.00–0.50)

## 2020-10-13 LAB — BRAIN NATRIURETIC PEPTIDE: B Natriuretic Peptide: 14 pg/mL (ref 0.0–100.0)

## 2020-10-13 LAB — TROPONIN I (HIGH SENSITIVITY): Troponin I (High Sensitivity): 3 ng/L (ref <18)

## 2020-10-13 MED ORDER — IPRATROPIUM BROMIDE HFA 17 MCG/ACT IN AERS
2.0000 | INHALATION_SPRAY | Freq: Once | RESPIRATORY_TRACT | Status: DC
  Filled 2020-10-13: qty 12.9

## 2020-10-13 MED ORDER — ALBUTEROL SULFATE HFA 108 (90 BASE) MCG/ACT IN AERS
4.0000 | INHALATION_SPRAY | Freq: Once | RESPIRATORY_TRACT | Status: AC
  Administered 2020-10-13: 4 via RESPIRATORY_TRACT
  Filled 2020-10-13: qty 6.7

## 2020-10-13 MED ORDER — METHYLPREDNISOLONE SODIUM SUCC 125 MG IJ SOLR
125.0000 mg | Freq: Once | INTRAMUSCULAR | Status: AC
  Administered 2020-10-13: 125 mg via INTRAVENOUS
  Filled 2020-10-13: qty 2

## 2020-10-13 MED ORDER — PREDNISONE 20 MG PO TABS: 40.0000 mg | ORAL_TABLET | Freq: Every day | ORAL | 0 refills | Status: DC

## 2020-10-13 MED ORDER — DOXYCYCLINE HYCLATE 100 MG PO CAPS: 100.0000 mg | ORAL_CAPSULE | Freq: Two times a day (BID) | ORAL | 0 refills | Status: DC

## 2020-10-13 MED ORDER — PREDNISONE 20 MG PO TABS: 40.0000 mg | ORAL_TABLET | Freq: Every day | ORAL | 0 refills | Status: AC

## 2020-10-13 MED ORDER — DOXYCYCLINE HYCLATE 100 MG PO CAPS: 100.0000 mg | ORAL_CAPSULE | Freq: Two times a day (BID) | ORAL | 0 refills | Status: AC

## 2020-10-13 MED ORDER — SODIUM CHLORIDE 0.9 % IV BOLUS
500.0000 mL | Freq: Once | INTRAVENOUS | Status: AC
  Administered 2020-10-13: 500 mL via INTRAVENOUS

## 2020-10-13 NOTE — ED Notes (Signed)
Patient ambulated on room air, Oxygen steady at 92-93 %.

## 2020-10-13 NOTE — ED Triage Notes (Signed)
C/o productive cough for over a week, states the cough is worse a night

## 2020-10-13 NOTE — Discharge Instructions (Addendum)
Seen here for shortness of breath.  Lab work and imaging all looks reassuring.  I have started you on antibiotics please take as prescribed.  I have also given you a short course of steroids please take as prescribed.    You may take the albuterol and Atrovent as needed for shortness of breath, may take 2 puffs of each every 6 hours.  Please be aware that the steroids and the inhalers can increase your heart rate.   I would like you to follow-up in 5 to 7 days for reevaluation repeat chest x-ray.  Come back to the emergency department if you develop chest pain, shortness of breath, severe abdominal pain, uncontrolled nausea, vomiting, diarrhea.

## 2020-10-13 NOTE — ED Provider Notes (Signed)
[facility] EMERGENCY DEPARTMENT Provider Note   CSN: [id number] Arrival date & time: 10/13/20  1334     History Chief Complaint  Patient presents with  . Cough    Jeremiah Yates is a 44 y.o. male.  HPI   Patient with significant medical history of hypertension, kidney stones presents to the emergency department with chief complaint of cough and shortness of breath.  Patient states cough started about 1 week ago and has stayed consistent.  He states the coughing becomes worse at nighttime, he states it is productive at times, and sometimes he feels slightly short of breath and has slight nasal congestion.  He denies fevers, chills, sore throat, chest pain, abdominal pain, nausea, vomiting, diarrhea, general body malaise.  He endorses that he is not vaccinated against COVID-19, denies recent sick contacts, is not immunocompromise.  He denies cardiac history, denies PE or DVT history, denies leg pain, leg swelling, recent surgeries or long immobilization, denies smoking history.  He denies any sort of alleviating factors at this time.  Past Medical History:  Diagnosis Date  . Hypertension   . Kidney stones   . Kidney stones     Patient Active Problem List   Diagnosis Date Noted  . Acute renal failure (HCC) 06/04/2012  . Leukocytosis 06/02/2012  . Left flank pain 06/02/2012  . 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 Nephrolithiasis 06/01/2012    Past Surgical History:  Procedure Laterality Date  . SPLENECTOMY  circa 1998  . SPLENECTOMY  1998       Family History  Problem Relation Age of Onset  . Breast cancer Mother        alive at age 55  . Lung cancer Father        alive at age 37    Social History   Tobacco Use  . Smoking status: Never Smoker  . Smokeless tobacco: Never Used  Substance Use Topics  . Alcohol use: Yes    Alcohol/week: 1.0 standard drink    Types: 1 drink(s) per week  . Drug use: No    Home Medications Prior to Admission medications    Medication Sig Start Date End Date Taking? Authorizing Provider  doxycycline (VIBRAMYCIN) 100 MG capsule Take 1 capsule (100 mg total) by mouth 2 (two) times daily for 5 days. 10/13/20 10/18/20  Carroll Sage, PA-C  predniSONE (DELTASONE) 20 MG tablet Take 2 tablets (40 mg total) by mouth daily for 5 days. 10/13/20 10/18/20  Carroll Sage, PA-C  Tamsulosin HCl (FLOMAX) 0.4 MG CAPS Take 1 capsule (0.4 mg total) by mouth daily. 06/04/12   Standley Brooking, MD    Allergies    Patient has no known allergies.  Review of Systems   Review of Systems  Constitutional: Negative for chills and fever.  HENT: Positive for congestion. Negative for sore throat.   Eyes: Negative for visual disturbance.  Respiratory: Positive for cough and shortness of breath.   Cardiovascular: Negative for chest pain.  Gastrointestinal: Negative for abdominal pain, diarrhea, nausea and vomiting.  Genitourinary: Negative for enuresis and flank pain.  Musculoskeletal: Negative for back pain and myalgias.  Skin: Negative for rash.  Neurological: Negative for headaches.  Hematological: Does not bruise/bleed easily.    Physical Exam Updated Vital Signs BP (!) 145/97 (BP Location: Left Arm)   Pulse (!) 104   Temp 99 F (37.2 C) (Oral)   Resp 18   Ht 5\' 8"  (1.727 m)   Wt 99.6 kg  SpO2 94%   BMI 33.39 kg/m   Physical Exam Vitals and nursing note reviewed.  Constitutional:      General: He is not in acute distress.    Appearance: He is not ill-appearing.  HENT:     Head: Normocephalic and atraumatic.     Right Ear: Tympanic membrane, ear canal and external ear normal.     Left Ear: Tympanic membrane, ear canal and external ear normal.     Nose: Congestion present.     Comments: Patient has bilateral erythematous turbinates, positive nasal congestion.    Mouth/Throat:     Mouth: Mucous membranes are moist.     Pharynx: Oropharynx is clear. No oropharyngeal exudate or posterior oropharyngeal  erythema.  Eyes:     Conjunctiva/sclera: Conjunctivae normal.  Cardiovascular:     Rate and Rhythm: Normal rate and regular rhythm.     Pulses: Normal pulses.     Heart sounds: No murmur heard. No friction rub. No gallop.   Pulmonary:     Effort: No respiratory distress.     Breath sounds: Wheezing and rhonchi present. No rales.     Comments: Patient has good chest rise and fall with respirations, he has noted bilateral wheezing or rhonchi from the lower lobes up to the mid lobe.  No rales present. Abdominal:     Palpations: Abdomen is soft.     Tenderness: There is no abdominal tenderness.  Musculoskeletal:     Cervical back: Normal range of motion. No tenderness.     Right lower leg: No edema.     Left lower leg: No edema.     Comments: Patient is moving all 4 extremities out difficulty.  Skin:    General: Skin is warm and dry.  Neurological:     Mental Status: He is alert.     Comments: Patient is having no difficulty with word finding.  Psychiatric:        Mood and Affect: Mood normal.     ED Results / Procedures / Treatments   Labs (all labs ordered are listed, but only abnormal results are displayed) Labs Reviewed  BASIC METABOLIC PANEL - Abnormal; Notable for the following components:      Result Value   Glucose, Bld 177 (*)    BUN 21 (*)    All other components within normal limits  CBC WITH DIFFERENTIAL/PLATELET - Abnormal; Notable for the following components:   Platelets 463 (*)    All other components within normal limits  RESP PANEL BY RT-PCR (FLU A&B, COVID) [id number]  D-DIMER, QUANTITATIVE (NOT [facility])  BRAIN NATRIURETIC PEPTIDE  TROPONIN I (HIGH SENSITIVITY)    EKG EKG Interpretation  Date/Time:  Thursday October 13 2020 18:15:16 EST Ventricular Rate:  108 PR Interval:  138 QRS Duration: 82 QT Interval:  398 QTC Calculation: 533 R Axis:   75 Text Interpretation: Sinus tachycardia Prolonged QT Abnormal ECG No STEMI Confirmed by Alvester Chourifan, Matthew  [phone]([id number]) on 10/13/2020 6:44:52 PM   Radiology DG Chest Port 1 View  Result Date: 10/13/2020 CLINICAL DATA:  Cough.  Worse at night. EXAM: PORTABLE CHEST 1 VIEW COMPARISON:  CT abdomen pelvis 06/01/2012. FINDINGS: The heart size and mediastinal contours are within normal limits. No focal consolidation. Coarsened interstitial markings with no overt pulmonary edema. No pleural effusion. No pneumothorax. No acute osseous abnormality. IMPRESSION: Coarsened interstitial markings with no overt pulmonary edema. Electronically Signed   By: Tish FredericksonMorgane  Naveau M.D.   On: 10/13/2020 18:08    Procedures  Procedures (including critical care time)  Medications Ordered in ED Medications  ipratropium (ATROVENT HFA) inhaler 2 puff (2 puffs Inhalation Not Given 10/13/20 1752)  albuterol (VENTOLIN HFA) 108 (90 Base) MCG/ACT inhaler 4 puff (4 puffs Inhalation Given 10/13/20 1916)  methylPREDNISolone sodium succinate (SOLU-MEDROL) 125 mg/2 mL injection 125 mg (125 mg Intravenous Given 10/13/20 1847)  sodium chloride 0.9 % bolus 500 mL (0 mLs Intravenous Stopped 10/13/20 1917)    ED Course  I have reviewed the triage vital signs and the nursing notes.  Pertinent labs & imaging results that were available during my care of the patient were reviewed by me and considered in my medical decision making (see chart for details).    MDM Rules/Calculators/A&P                          Patient presents with shortness of breath and cough.  He is alert, does not appear in acute distress, vital signs sent for hypoxia.  Will obtain basic lab work-up, start patient on fluids, and provide him with dual neb, steroids and reevaluate.  Patient was provided steroids and duo nebs, lung sounds are still coarse, has continued rales and rhonchi in the bilateral lobes chest does not sound as tight as prior.  O2 sats have improved, remain in the mid to upper 90s, vital signs have remained stable.  CBC negative for leukocytosis or signs  of anemia.  D-dimer negative, 0.29, respiratory panel negative for influenza/Covid.  BMP negative for electrolyte abnormalities, no metabolic acidosis, shows hyperglycemia of 177, elevated BUN of 21, no AKI, no anion gap present.  This troponin is 3.  BNP 14.  Chest x-ray shows coarse interstitial markings with no overt pulmonary edema.  EKG shows sinus rhythm without signs of ischemia no ST elevation depression noted. prolonged QT, patient is not on antimedics or other medication would cause QT prolongation.  I have low suspicion for ACS as history is atypical, patient has no cardiac history, EKG was sinus rhythm without signs of ischemia, initial troponin is 3, will defer 2nd troponin as patient has been chest pain-free for over 12 hours.  Low suspicion for PE as patient denies pleuritic chest pain, patient denies leg pain, no pedal edema noted on exam, negative D-dimer. low suspicion for AAA or aortic dissection as history is atypical, patient has low risk factors.  Low suspicion for systemic infection as patient is nontoxic-appearing, vital signs reassuring, no obvious source infection noted on exam.  Low suspicion patient has been hospitalized due to viral URI as patient has no new oxygen requirements.  I suspect patient may be suffering from possible URI, will start him on steroids and antibiotics.  will have him follow-up with PCP for reevaluation in 5 days.  Vital signs have remained stable, no indication for hospital admission.  Patient discussed with attending and they agreed with assessment and plan.  Patient given at home care as well strict return precautions.  Patient verbalized that they understood agreed to said plan.   Final Clinical Impression(s) / ED Diagnoses Final diagnoses:  SOB (shortness of breath)  Cough    Rx / DC Orders ED Discharge Orders         Ordered    predniSONE (DELTASONE) 20 MG tablet  Daily,   Status:  Discontinued        10/13/20 2021    doxycycline  (VIBRAMYCIN) 100 MG capsule  2 times daily,   Status:  Discontinued  10/13/20 2021    doxycycline (VIBRAMYCIN) 100 MG capsule  2 times daily        10/13/20 2025    predniSONE (DELTASONE) 20 MG tablet  Daily        10/13/20 2025           Barnie Del 10/13/20 2040    Terald Sleeper, MD 10/14/20 1126

## 2021-11-07 ENCOUNTER — Emergency Department (HOSPITAL_COMMUNITY)

## 2021-11-07 ENCOUNTER — Encounter (HOSPITAL_COMMUNITY): Payer: Self-pay | Admitting: Radiology

## 2021-11-07 ENCOUNTER — Inpatient Hospital Stay (HOSPITAL_COMMUNITY)

## 2021-11-07 ENCOUNTER — Inpatient Hospital Stay (HOSPITAL_COMMUNITY)
Admission: EM | Admit: 2021-11-07 | Discharge: 2021-11-14 | DRG: 070 | Attending: Internal Medicine | Admitting: Internal Medicine

## 2021-11-07 DIAGNOSIS — B955 Unspecified streptococcus as the cause of diseases classified elsewhere: Secondary | ICD-10-CM | POA: Diagnosis present

## 2021-11-07 DIAGNOSIS — G9340 Encephalopathy, unspecified: Secondary | ICD-10-CM | POA: Diagnosis present

## 2021-11-07 DIAGNOSIS — R7881 Bacteremia: Secondary | ICD-10-CM | POA: Diagnosis present

## 2021-11-07 DIAGNOSIS — I10 Essential (primary) hypertension: Secondary | ICD-10-CM | POA: Diagnosis present

## 2021-11-07 DIAGNOSIS — J45901 Unspecified asthma with (acute) exacerbation: Secondary | ICD-10-CM | POA: Diagnosis present

## 2021-11-07 DIAGNOSIS — Z79899 Other long term (current) drug therapy: Secondary | ICD-10-CM | POA: Diagnosis not present

## 2021-11-07 DIAGNOSIS — R4182 Altered mental status, unspecified: Secondary | ICD-10-CM | POA: Diagnosis present

## 2021-11-07 DIAGNOSIS — I340 Nonrheumatic mitral (valve) insufficiency: Secondary | ICD-10-CM | POA: Diagnosis not present

## 2021-11-07 DIAGNOSIS — Z791 Long term (current) use of non-steroidal anti-inflammatories (NSAID): Secondary | ICD-10-CM | POA: Diagnosis not present

## 2021-11-07 DIAGNOSIS — J9691 Respiratory failure, unspecified with hypoxia: Secondary | ICD-10-CM

## 2021-11-07 DIAGNOSIS — J9601 Acute respiratory failure with hypoxia: Secondary | ICD-10-CM | POA: Diagnosis present

## 2021-11-07 DIAGNOSIS — Z87442 Personal history of urinary calculi: Secondary | ICD-10-CM

## 2021-11-07 DIAGNOSIS — R109 Unspecified abdominal pain: Secondary | ICD-10-CM

## 2021-11-07 DIAGNOSIS — R0603 Acute respiratory distress: Secondary | ICD-10-CM

## 2021-11-07 DIAGNOSIS — Z9081 Acquired absence of spleen: Secondary | ICD-10-CM

## 2021-11-07 DIAGNOSIS — Z20822 Contact with and (suspected) exposure to covid-19: Secondary | ICD-10-CM | POA: Diagnosis present

## 2021-11-07 DIAGNOSIS — R4189 Other symptoms and signs involving cognitive functions and awareness: Secondary | ICD-10-CM | POA: Diagnosis not present

## 2021-11-07 DIAGNOSIS — R0602 Shortness of breath: Secondary | ICD-10-CM

## 2021-11-07 DIAGNOSIS — R402431 Glasgow coma scale score 3-8, in the field [EMT or ambulance]: Secondary | ICD-10-CM

## 2021-11-07 HISTORY — DX: Unspecified asthma, uncomplicated: J45.909

## 2021-11-07 LAB — COMPREHENSIVE METABOLIC PANEL
ALT: 29 U/L (ref 0–44)
AST: 21 U/L (ref 15–41)
Albumin: 4.1 g/dL (ref 3.5–5.0)
Alkaline Phosphatase: 73 U/L (ref 38–126)
Anion gap: 11 (ref 5–15)
BUN: 13 mg/dL (ref 6–20)
CO2: 22 mmol/L (ref 22–32)
Calcium: 9 mg/dL (ref 8.9–10.3)
Chloride: 104 mmol/L (ref 98–111)
Creatinine, Ser: 1.12 mg/dL (ref 0.61–1.24)
GFR, Estimated: >60 mL/min (ref >60)
Glucose, Bld: 178 mg/dL — ABNORMAL HIGH (ref 70–99)
Potassium: 3.8 mmol/L (ref 3.5–5.1)
Sodium: 137 mmol/L (ref 135–145)
Total Bilirubin: 0.6 mg/dL (ref 0.3–1.2)
Total Protein: 7.7 g/dL (ref 6.5–8.1)

## 2021-11-07 LAB — URINALYSIS, ROUTINE W REFLEX MICROSCOPIC
Bacteria, UA: NONE SEEN
Bilirubin Urine: NEGATIVE
Glucose, UA: 50 mg/dL — AB
Hgb urine dipstick: NEGATIVE
Ketones, ur: 20 mg/dL — AB
Leukocytes,Ua: NEGATIVE
Nitrite: NEGATIVE
Protein, ur: 30 mg/dL — AB
Specific Gravity, Urine: 1.012 (ref 1.005–1.030)
pH: 7 (ref 5.0–8.0)

## 2021-11-07 LAB — BLOOD GAS, VENOUS
Acid-Base Excess: 0.2 mmol/L (ref 0.0–2.0)
Bicarbonate: 23.1 mmol/L (ref 20.0–28.0)
FIO2: 21
O2 Saturation: 63.2 %
Patient temperature: 36
pCO2, Ven: 44.9 mmHg (ref 44.0–60.0)
pH, Ven: 7.361 (ref 7.250–7.430)
pO2, Ven: 34.3 mmHg (ref 32.0–45.0)

## 2021-11-07 LAB — RESP PANEL BY RT-PCR (FLU A&B, COVID) ARPGX2
Influenza A by PCR: NEGATIVE
Influenza B by PCR: NEGATIVE
SARS Coronavirus 2 by RT PCR: NEGATIVE

## 2021-11-07 LAB — RAPID URINE DRUG SCREEN, HOSP PERFORMED
Amphetamines: NOT DETECTED
Barbiturates: NOT DETECTED
Benzodiazepines: POSITIVE — AB
Cocaine: NOT DETECTED
Opiates: NOT DETECTED
Tetrahydrocannabinol: NOT DETECTED

## 2021-11-07 LAB — MAGNESIUM: Magnesium: 1.9 mg/dL (ref 1.7–2.4)

## 2021-11-07 LAB — CBC WITH DIFFERENTIAL/PLATELET
Abs Immature Granulocytes: 0.1 10*3/uL — ABNORMAL HIGH (ref 0.00–0.07)
Basophils Absolute: 0.1 10*3/uL (ref 0.0–0.1)
Basophils Relative: 1 %
Eosinophils Absolute: 0.2 10*3/uL (ref 0.0–0.5)
Eosinophils Relative: 1 %
HCT: 47.8 % (ref 39.0–52.0)
Hemoglobin: 15.6 g/dL (ref 13.0–17.0)
Immature Granulocytes: 1 %
Lymphocytes Relative: 14 %
Lymphs Abs: 2 10*3/uL (ref 0.7–4.0)
MCH: 31.5 pg (ref 26.0–34.0)
MCHC: 32.6 g/dL (ref 30.0–36.0)
MCV: 96.6 fL (ref 80.0–100.0)
Monocytes Absolute: 0.6 10*3/uL (ref 0.1–1.0)
Monocytes Relative: 4 %
Neutro Abs: 11.6 10*3/uL — ABNORMAL HIGH (ref 1.7–7.7)
Neutrophils Relative %: 79 %
Platelets: 278 10*3/uL (ref 150–400)
RBC: 4.95 MIL/uL (ref 4.22–5.81)
RDW: 15.4 % (ref 11.5–15.5)
WBC: 14.6 10*3/uL — ABNORMAL HIGH (ref 4.0–10.5)
nRBC: 0 % (ref 0.0–0.2)

## 2021-11-07 LAB — TROPONIN I (HIGH SENSITIVITY)
Troponin I (High Sensitivity): 5 ng/L (ref <18)
Troponin I (High Sensitivity): 4 ng/L (ref <18)

## 2021-11-07 LAB — BLOOD GAS, ARTERIAL
Acid-base deficit: 3 mmol/L — ABNORMAL HIGH (ref 0.0–2.0)
Bicarbonate: 21.5 mmol/L (ref 20.0–28.0)
Drawn by: 21310
FIO2: 40
O2 Saturation: 94.8 %
Patient temperature: 35.9
pCO2 arterial: 40.9 mmHg (ref 32.0–48.0)
pH, Arterial: 7.344 — ABNORMAL LOW (ref 7.350–7.450)
pO2, Arterial: 75.5 mmHg — ABNORMAL LOW (ref 83.0–108.0)

## 2021-11-07 LAB — ACETAMINOPHEN LEVEL: Acetaminophen (Tylenol), Serum: <10 ug/mL — ABNORMAL LOW (ref 10–30)

## 2021-11-07 LAB — CBG MONITORING, ED: Glucose-Capillary: 146 mg/dL — ABNORMAL HIGH (ref 70–99)

## 2021-11-07 LAB — ETHANOL: Alcohol, Ethyl (B): <10 mg/dL (ref <10)

## 2021-11-07 LAB — SALICYLATE LEVEL: Salicylate Lvl: <7 mg/dL — ABNORMAL LOW (ref 7.0–30.0)

## 2021-11-07 LAB — LACTIC ACID, PLASMA: Lactic Acid, Venous: 2 mmol/L (ref 0.5–1.9)

## 2021-11-07 MED ORDER — IOHEXOL 350 MG/ML SOLN
100.0000 mL | Freq: Once | INTRAVENOUS | Status: AC | PRN
  Administered 2021-11-07: 100 mL via INTRAVENOUS

## 2021-11-07 MED ORDER — PROPOFOL 1000 MG/100ML IV EMUL
INTRAVENOUS | Status: AC
  Administered 2021-11-07: 5 ug
  Filled 2021-11-07: qty 100

## 2021-11-07 MED ORDER — PROPOFOL 1000 MG/100ML IV EMUL
INTRAVENOUS | Status: AC
  Administered 2021-11-08: 30 ug/kg/min
  Filled 2021-11-07: qty 100

## 2021-11-07 MED ORDER — ALBUTEROL SULFATE (2.5 MG/3ML) 0.083% IN NEBU
5.0000 mg | INHALATION_SOLUTION | Freq: Once | RESPIRATORY_TRACT | Status: AC
  Administered 2021-11-07: 5 mg via RESPIRATORY_TRACT
  Filled 2021-11-07: qty 6

## 2021-11-07 MED ORDER — MIDAZOLAM HCL 5 MG/5ML IJ SOLN
INTRAMUSCULAR | Status: AC
  Filled 2021-11-07: qty 5

## 2021-11-07 MED ORDER — FENTANYL CITRATE PF 50 MCG/ML IJ SOSY
100.0000 ug | PREFILLED_SYRINGE | INTRAMUSCULAR | Status: DC | PRN
  Administered 2021-11-07 (×2): 100 ug via INTRAVENOUS
  Filled 2021-11-07 (×2): qty 2

## 2021-11-07 MED ORDER — LACTATED RINGERS IV BOLUS
1000.0000 mL | Freq: Once | INTRAVENOUS | Status: AC
  Administered 2021-11-07: 1000 mL via INTRAVENOUS

## 2021-11-07 MED ORDER — LACTATED RINGERS IV SOLN: INTRAVENOUS | Status: DC

## 2021-11-07 MED ORDER — METHYLPREDNISOLONE SODIUM SUCC 125 MG IJ SOLR
125.0000 mg | Freq: Once | INTRAMUSCULAR | Status: AC
  Administered 2021-11-07: 125 mg via INTRAVENOUS
  Filled 2021-11-07: qty 2

## 2021-11-07 MED ORDER — IPRATROPIUM-ALBUTEROL 0.5-2.5 (3) MG/3ML IN SOLN
3.0000 mL | Freq: Once | RESPIRATORY_TRACT | Status: AC
  Administered 2021-11-07: 3 mL via RESPIRATORY_TRACT
  Filled 2021-11-07: qty 3

## 2021-11-07 MED ORDER — SUCCINYLCHOLINE CHLORIDE 200 MG/10ML IV SOSY
PREFILLED_SYRINGE | INTRAVENOUS | Status: AC
  Administered 2021-11-07: 150 mg
  Filled 2021-11-07: qty 10

## 2021-11-07 MED ORDER — MIDAZOLAM HCL 5 MG/5ML IJ SOLN
5.0000 mg | Freq: Once | INTRAMUSCULAR | Status: AC
  Administered 2021-11-07: 5 mg via INTRAVENOUS

## 2021-11-07 MED ORDER — NALOXONE HCL 0.4 MG/ML IJ SOLN
INTRAMUSCULAR | Status: AC
  Filled 2021-11-07: qty 1

## 2021-11-07 MED ORDER — ETOMIDATE 2 MG/ML IV SOLN
INTRAVENOUS | Status: AC
  Administered 2021-11-07: 20 mg
  Filled 2021-11-07: qty 10

## 2021-11-07 NOTE — Plan of Care (Signed)
ON CALL PHONE CONSULT  Call from [email or web address]   Discussion-patient [facility] unresponsive from [facility].  No eye deviation, no seizure.  Unable to protect airway, GCS 3.  Intubated. Given Narcan without any benefit prior to intubation. Wanted further recommendations from a neurological standpoint. Definitely would need an MRI and an EEG which can be done during daytime at [facility] but if her transfer is desired to [facility], can be done there as well. I would also recommend checking a CT angio head and neck to rule out basilar occlusion/LVO stroke as well as toxicology screen. Please call neurology if needed for formal consultation once at [facility].  -- Milon Dikes, MD Neurologist [facility] Pager: [phone]

## 2021-11-07 NOTE — Progress Notes (Signed)
Pt transported to ct and back with no complications. Pt back in room 19 on vent tolerating well. Vt increased to 550 per pt 8cc per weight . Will obtain abg after patient moved to room 1 and on vent for 15/20 minutes undisturbed

## 2021-11-07 NOTE — ED Triage Notes (Signed)
Pt BIB EMS from jail after he was found unresponsive. Per EMS, pt received narcan and mentation did not change. Per sheriff, pt has hx of asthma.

## 2021-11-07 NOTE — ED Provider Notes (Signed)
[facility] Provider Note   CSN: [id number] Arrival date & time: 11/07/21  1925     History  No chief complaint on file.   Jeremiah Yates is a 46 y.o. male.  HPI Patient presents for unresponsiveness.  He arrives from jail.  Per [employer] officers, he has been in jail for 1 year.  He has been a "model inmate".  He was last seen normal at approximately 5 PM.  1.5 hours later, he was found, in his bed, unresponsive.  EMS was called.  EMS reports he had intermittent episodes of apnea.  He was given 4 mg of intranasal Narcan without response.  He did withdraw from noxious stimuli (ammonia capsule).  He has a history of asthma.  No other medical history is known.    Home Medications Prior to Admission medications   Medication Sig Start Date End Date Taking? Authorizing Provider  albuterol (VENTOLIN HFA) 108 (90 Base) MCG/ACT inhaler Inhale 2 puffs into the lungs every 4 (four) hours as needed for wheezing or shortness of breath. 10/11/21  Yes [provider]  Tamsulosin HCl (FLOMAX) 0.4 MG CAPS Take 1 capsule (0.4 mg total) by mouth daily. Patient not taking: Reported on 11/08/2021 06/04/12   Samuella Cota, MD      Allergies    Patient has no known allergies.    Review of Systems   Review of Systems  Unable to perform ROS: Patient unresponsive   Physical Exam Updated Vital Signs BP 111/81    Pulse 86    Temp 98 F (36.7 C) (Axillary)    Resp 17    Ht 5' 8"  (1.727 m)    Wt 95.7 kg    SpO2 97%    BMI 32.08 kg/m  Physical Exam Constitutional:      Appearance: He is well-developed and overweight. He is ill-appearing. He is not toxic-appearing or diaphoretic.  HENT:     Head: Normocephalic and atraumatic.     Right Ear: External ear normal.     Left Ear: External ear normal.     Nose: Nose normal.     Mouth/Throat:     Mouth: Mucous membranes are moist.     Pharynx: Oropharynx is clear.     Comments: No tongue bite Eyes:     Comments: No gaze  deviation.  Pupils 2 mm and sluggish bilaterally.  Conjunctiva normal.  Cardiovascular:     Rate and Rhythm: Regular rhythm. Tachycardia present.  Pulmonary:     Effort: Accessory muscle usage, prolonged expiration and respiratory distress present.     Breath sounds: Decreased air movement present. Decreased breath sounds and wheezing present.  Abdominal:     General: There is no distension.     Palpations: Abdomen is soft.     Comments: Well-healed midline abdominal scar  Musculoskeletal:        General: No deformity.     Cervical back: Neck supple.     Right lower leg: No edema.     Left lower leg: No edema.  Skin:    General: Skin is warm and dry.  Neurological:     GCS: GCS eye subscore is 1. GCS verbal subscore is 1. GCS motor subscore is 4.     Cranial Nerves: No facial asymmetry.     Comments: Nonpurposeful movements of all extremities from noxious stimuli    ED Results / Procedures / Treatments   Labs (all labs ordered are listed, but only abnormal results are  displayed) Labs Reviewed  COMPREHENSIVE METABOLIC PANEL - Abnormal; Notable for the following components:      Result Value   Glucose, Bld 178 (*)    All other components within normal limits  CBC WITH DIFFERENTIAL/PLATELET - Abnormal; Notable for the following components:   WBC 14.6 (*)    Neutro Abs 11.6 (*)    Abs Immature Granulocytes 0.10 (*)    All other components within normal limits  URINALYSIS, ROUTINE W REFLEX MICROSCOPIC - Abnormal; Notable for the following components:   Glucose, UA 50 (*)    Ketones, ur 20 (*)    Protein, ur 30 (*)    All other components within normal limits  RAPID URINE DRUG SCREEN, HOSP PERFORMED - Abnormal; Notable for the following components:   Benzodiazepines POSITIVE (*)    All other components within normal limits  LACTIC ACID, PLASMA - Abnormal; Notable for the following components:   Lactic Acid, Venous 2.0 (*)    All other components within normal limits  BLOOD  GAS, ARTERIAL - Abnormal; Notable for the following components:   pH, Arterial 7.344 (*)    pO2, Arterial 75.5 (*)    Acid-base deficit 3.0 (*)    All other components within normal limits  ACETAMINOPHEN LEVEL - Abnormal; Notable for the following components:   Acetaminophen (Tylenol), Serum <10 (*)    All other components within normal limits  SALICYLATE LEVEL - Abnormal; Notable for the following components:   Salicylate Lvl <5.7 (*)    All other components within normal limits  GLUCOSE, CAPILLARY - Abnormal; Notable for the following components:   Glucose-Capillary 115 (*)    All other components within normal limits  CBC - Abnormal; Notable for the following components:   WBC 15.7 (*)    All other components within normal limits  COMPREHENSIVE METABOLIC PANEL - Abnormal; Notable for the following components:   Glucose, Bld 146 (*)    Total Protein 6.4 (*)    Albumin 3.3 (*)    All other components within normal limits  GLUCOSE, CAPILLARY - Abnormal; Notable for the following components:   Glucose-Capillary 156 (*)    All other components within normal limits  GLUCOSE, CAPILLARY - Abnormal; Notable for the following components:   Glucose-Capillary 143 (*)    All other components within normal limits  HEMOGLOBIN A1C - Abnormal; Notable for the following components:   Hgb A1c MFr Bld 5.8 (*)    All other components within normal limits  GLUCOSE, CAPILLARY - Abnormal; Notable for the following components:   Glucose-Capillary 134 (*)    All other components within normal limits  GLUCOSE, CAPILLARY - Abnormal; Notable for the following components:   Glucose-Capillary 140 (*)    All other components within normal limits  CBG MONITORING, ED - Abnormal; Notable for the following components:   Glucose-Capillary 146 (*)    All other components within normal limits  CULTURE, BLOOD (ROUTINE X 2)  CULTURE, BLOOD (ROUTINE X 2)  RESP PANEL BY RT-PCR (FLU A&B, COVID) ARPGX2  MRSA NEXT  GEN BY PCR, NASAL  MAGNESIUM  BLOOD GAS, VENOUS  ETHANOL  MAGNESIUM  AMMONIA  TSH  HIV ANTIBODY (ROUTINE TESTING W REFLEX)  TROPONIN I (HIGH SENSITIVITY)  TROPONIN I (HIGH SENSITIVITY)    EKG EKG Interpretation  Date/Time:  Tuesday November 07 2021 19:36:31 EST Ventricular Rate:  107 PR Interval:  137 QRS Duration: 88 QT Interval:  359 QTC Calculation: 479 R Axis:   45 Text Interpretation: Sinus  tachycardia Borderline prolonged QT interval Baseline wander in lead(s) V4 Confirmed by Godfrey Pick [phone]) on 11/07/2021 9:41:41 PM  Radiology CT ANGIO HEAD NECK W WO CM  Result Date: 11/08/2021 CLINICAL DATA:  Initial evaluation for neuro deficit, stroke suspected. EXAM: CT ANGIOGRAPHY HEAD AND NECK TECHNIQUE: Multidetector CT imaging of the head and neck was performed using the standard protocol during bolus administration of intravenous contrast. Multiplanar CT image reconstructions and MIPs were obtained to evaluate the vascular anatomy. Carotid stenosis measurements (when applicable) are obtained utilizing NASCET criteria, using the distal internal carotid diameter as the denominator. RADIATION DOSE REDUCTION: This exam was performed according to the departmental dose-optimization program which includes automated exposure control, adjustment of the mA and/or kV according to patient size and/or use of iterative reconstruction technique. CONTRAST:  168m OMNIPAQUE IOHEXOL 350 MG/ML SOLN COMPARISON:  Head CT from 11/07/2021 FINDINGS: CTA NECK FINDINGS Aortic arch: Visualized aortic arch normal caliber with normal branch pattern. No stenosis about the origin the great vessels. Right carotid system: Right common and internal carotid arteries widely patent without stenosis, dissection or occlusion. Left carotid system: Left common and internal carotid arteries widely patent without stenosis, dissection or occlusion. Vertebral arteries: Left vertebral artery arises directly from the aortic arch. Right  vertebral artery dominant. Vertebral arteries patent without stenosis or dissection. Skeleton: No discrete or worrisome osseous lesions. Poor dentition noted. Other neck: Endotracheal and enteric tubes in place. No other acute soft tissue abnormality within the neck. 6 mm right thyroid nodule, of doubtful significance given size and patient age, no follow-up imaging recommended (ref: J Am Coll Radiol. 2015 Feb;12(2): 143-50). Upper chest: Better evaluated on concomitant CT of the chest. Scattered atelectatic changes noted within the visualized lungs. Visualized upper chest demonstrates no other acute finding. Review of the MIP images confirms the above findings CTA HEAD FINDINGS Anterior circulation: Both internal carotid arteries widely patent to the termini without stenosis. A1 segments widely patent. Normal anterior communicating artery complex. Both anterior cerebral arteries widely patent to their distal aspects without stenosis. No M1 stenosis or occlusion. Normal MCA bifurcations. Distal MCA branches well perfused and symmetric. Posterior circulation: Both V4 segments patent to the vertebrobasilar junction without stenosis. Neither PICA origin well visualized. Basilar widely patent to its distal aspect without stenosis. Superior cerebellar arteries patent bilaterally. Both PCAs primarily supplied via the basilar and are well perfused to there distal aspects. Venous sinuses: Grossly patent allowing for timing the contrast bolus. Anatomic variants: None significant.  No aneurysm. Review of the MIP images confirms the above findings IMPRESSION: Negative CTA of the head and neck. No large vessel occlusion, hemodynamically significant stenosis, or other acute vascular abnormality. Electronically Signed   By: BJeannine BogaM.D.   On: 11/08/2021 01:30   DG Abd 1 View  Result Date: 11/08/2021 CLINICAL DATA:  Rule out metallic foreign body prior to MRI. EXAM: ABDOMEN - 1 VIEW COMPARISON:  None. FINDINGS:  Normal bowel gas pattern. Excreted contrast in both renal collecting systems, proximal ureters and urinary bladder. Foley catheter in the bladder. Nasogastric tube tip in the distal stomach. No metallic foreign bodies are seen. Nonaggressive appearing lucent and sclerotic lesion in the left superior acetabulum. IMPRESSION: No metallic foreign body or acute abnormality. Electronically Signed   By: SClaudie ReveringM.D.   On: 11/08/2021 08:43   CT HEAD WO CONTRAST (5MM)  Result Date: 11/07/2021 CLINICAL DATA:  Mental status change, unknown cause. Found unresponsive EXAM: CT HEAD WITHOUT CONTRAST TECHNIQUE: Contiguous axial images  were obtained from the base of the skull through the vertex without intravenous contrast. RADIATION DOSE REDUCTION: This exam was performed according to the departmental dose-optimization program which includes automated exposure control, adjustment of the mA and/or kV according to patient size and/or use of iterative reconstruction technique. COMPARISON:  None. FINDINGS: Brain: No acute intracranial abnormality. Specifically, no hemorrhage, hydrocephalus, mass lesion, acute infarction, or significant intracranial injury. Vascular: No hyperdense vessel or unexpected calcification. Skull: No acute calvarial abnormality. Sinuses/Orbits: Diffuse mucosal thickening. Air-fluid level in the right maxillary sinus. Other: None IMPRESSION: No acute intracranial abnormality. Acute on chronic sinusitis. Electronically Signed   By: Rolm Baptise M.D.   On: 11/07/2021 20:34   CT CHEST WO CONTRAST  Result Date: 11/08/2021 CLINICAL DATA:  Unresponsiveness. EXAM: CT CHEST WITHOUT CONTRAST TECHNIQUE: Multidetector CT imaging of the chest was performed following the standard protocol without IV contrast. RADIATION DOSE REDUCTION: This exam was performed according to the departmental dose-optimization program which includes automated exposure control, adjustment of the mA and/or kV according to patient size  and/or use of iterative reconstruction technique. COMPARISON:  Chest radiograph dated 11/07/2021. FINDINGS: Evaluation of this exam is limited in the absence of intravenous contrast. Cardiovascular: There is no cardiomegaly or pericardial effusion. The thoracic aorta and central pulmonary arteries are grossly unremarkable on this noncontrast CT. Mediastinum/Nodes: No hilar or mediastinal adenopathy. An enteric tube is noted within the esophagus. No mediastinal fluid collection. Lungs/Pleura: Evaluation of the lungs is limited due to respiratory motion artifact. Diffuse interstitial prominence, likely chronic. Bibasilar subpleural atelectasis. No pleural effusion pneumothorax. The central airways are patent. Endotracheal tube with tip approximately 2 cm above the carina. Upper Abdomen: Rounded splenic tissue in the left upper abdomen. Musculoskeletal: No acute osseous pathology. IMPRESSION: No acute intrathoracic pathology. Electronically Signed   By: Anner Crete M.D.   On: 11/08/2021 00:22   MR BRAIN WO CONTRAST  Result Date: 11/08/2021 CLINICAL DATA:  Mental status change, unknown cause. EXAM: MRI HEAD WITHOUT CONTRAST TECHNIQUE: Multiplanar, multiecho pulse sequences of the brain and surrounding structures were obtained without intravenous contrast. COMPARISON:  Head CT November 07, 2021. FINDINGS: Brain: No acute infarction, hemorrhage, hydrocephalus, extra-axial collection or mass lesion.Few scattered foci of T2 hyperintensity are seen within the white matter of the cerebral hemispheres, nonspecific. Vascular: Normal flow voids. Skull and upper cervical spine: Normal marrow signal. Sinuses/Orbits: Mucosal thickening throughout the paranasal sinuses with fluid level within the right maxillary sinus. The orbits are maintained. Other: None. IMPRESSION: 1. No acute intracranial abnormality. 2. Minimal amount of nonspecific T2 hyperintense lesions of the white matter, may represent early chronic  microangiopathy, migraines related, demyelination or sequela of prior inflammatory/infectious process. Electronically Signed   By: Pedro Earls M.D.   On: 11/08/2021 11:19   DG Chest Portable 1 View  Result Date: 11/07/2021 CLINICAL DATA:  Intubation. EXAM: PORTABLE CHEST 1 VIEW COMPARISON:  Chest radiograph dated 10/13/2020. FINDINGS: Endotracheal tube with tip approximately 3 cm above the carina. Enteric tube extends below the diaphragm with tip in the body of the stomach. There is shallow inspiration. Diffuse interstitial coarsening and nodularity similar to prior radiograph and may be chronic. Mild edema is not excluded. No focal consolidation, pleural effusion, or pneumothorax. Stable cardiac silhouette. No acute osseous pathology. IMPRESSION: 1. Endotracheal tube with tip above the carina. 2. Enteric tube with tip in the body of the stomach. 3. Stable diffuse interstitial coarsening and nodularity similar to prior radiograph. Electronically Signed   By: Milas Hock  Radparvar M.D.   On: 11/07/2021 20:10   EEG adult  Result Date: 11/08/2021 Lora Havens, MD     11/08/2021  3:32 PM Patient Name: MARCE CHARLESWORTH MRN: [id number] Epilepsy Attending: Lora Havens Referring Physician/Provider: Dr Amie [place] Date: 11/08/2021 Duration: 21.34 mins Patient history: 46 year old with above past medical history brought in from his jail cell after being found unresponsive. EEG to evaluate for seizure Level of alertness: Awake, asleep/sedated AEDs during EEG study: Propofol Technical aspects: This EEG study was done with scalp electrodes positioned according to the 10-20 International system of electrode placement. Electrical activity was acquired at a sampling rate of 500Hz  and reviewed with a high frequency filter of 70Hz  and a low frequency filter of 1Hz . EEG data were recorded continuously and digitally stored. Description: The posterior dominant rhythm consists of 8 Hz activity of moderate voltage  (25-35 uV) seen predominantly in posterior head regions, symmetric and reactive to eye opening and eye closing. As propofol was increased, EEG showed generalized 15-18hz  beta activity. Hyperventilation and photic stimulation were not performed.   ABNORMALITY - Excessive beta, generalized IMPRESSION: This study is within normal limits. The excessive beta activity seen in the background is most likely due to the effect of medications like benzodiazepine and is a benign EEG pattern. No seizures or epileptiform discharges were seen throughout the recording. Lora Havens    Procedures Procedure Name: Intubation Date/Time: 11/07/2021 7:30 PM Performed by: Godfrey Pick, MD Oxygen Delivery Method: Ambu bag Preoxygenation: Pre-oxygenation with 100% oxygen Induction Type: Rapid sequence Ventilation: Mask ventilation without difficulty Laryngoscope Size: Glidescope Grade View: Grade I Tube size: 8.0 mm Number of attempts: 1 Airway Equipment and Method: Video-laryngoscopy Placement Confirmation: ETT inserted through vocal cords under direct vision, Positive ETCO2 and Breath sounds checked- equal and bilateral Secured at: 23 cm Tube secured with: ETT holder Dental Injury: Teeth and Oropharynx as per pre-operative assessment        Medications Ordered in ED Medications  Chlorhexidine Gluconate Cloth 2 % PADS 6 each (6 each Topical Given 11/08/21 0134)  chlorhexidine gluconate (MEDLINE KIT) (PERIDEX) 0.12 % solution 15 mL (15 mLs Mouth Rinse Given 11/08/21 0737)  MEDLINE mouth rinse (15 mLs Mouth Rinse Given 11/08/21 1511)  fentaNYL (SUBLIMAZE) injection 100 mcg (has no administration in time range)  budesonide (PULMICORT) nebulizer solution 0.25 mg (0.25 mg Nebulization Given 11/08/21 0807)  arformoterol (BROVANA) nebulizer solution 15 mcg (15 mcg Nebulization Given 11/08/21 0735)  polyethylene glycol (MIRALAX / GLYCOLAX) packet 17 g (has no administration in time range)  heparin injection 5,000  Units (5,000 Units Subcutaneous Given 11/08/21 1309)  docusate (COLACE) 50 MG/5ML liquid 100 mg (has no administration in time range)  pantoprazole (PROTONIX) injection 40 mg (40 mg Intravenous Given 11/08/21 0901)  methylPREDNISolone sodium succinate (SOLU-MEDROL) 40 mg/mL injection 40 mg (40 mg Intravenous Given 11/08/21 0737)  insulin aspart (novoLOG) injection 0-15 Units (2 Units Subcutaneous Given 11/08/21 1519)  0.9 %  sodium chloride infusion ( Intravenous Stopped 11/08/21 0925)  dexmedetomidine (PRECEDEX) 400 MCG/100ML (4 mcg/mL) infusion (0.5 mcg/kg/hr  95.7 kg Intravenous New Bag/Given 11/08/21 1509)  lactated ringers bolus 1,000 mL (0 mLs Intravenous Stopped 11/08/21 0014)  naloxone (NARCAN) 0.4 MG/ML injection (  Given 11/07/21 1937)  succinylcholine (ANECTINE) 200 MG/10ML syringe (150 mg  Given 11/07/21 1948)  etomidate (AMIDATE) 2 MG/ML injection (20 mg  Given 11/07/21 1948)  propofol (DIPRIVAN) 1000 MG/100ML infusion (0 mcg/kg/min  Stopped 11/08/21 0013)  midazolam (VERSED) 5 MG/5ML injection 5  mg (0 mg Intravenous Duplicate 7/86/76 7209)  ipratropium-albuterol (DUONEB) 0.5-2.5 (3) MG/3ML nebulizer solution 3 mL (3 mLs Nebulization Given 11/07/21 2038)  albuterol (PROVENTIL) (2.5 MG/3ML) 0.083% nebulizer solution 5 mg (5 mg Nebulization Given 11/07/21 2038)  iohexol (OMNIPAQUE) 350 MG/ML injection 100 mL (100 mLs Intravenous Contrast Given 11/07/21 2349)  methylPREDNISolone sodium succinate (SOLU-MEDROL) 125 mg/2 mL injection 125 mg (125 mg Intravenous Given 11/07/21 2315)  propofol (DIPRIVAN) 1000 MG/100ML infusion (0 mcg/kg/min  Stopped 11/08/21 0132)  magnesium sulfate IVPB 2 g 50 mL (0 g Intravenous Stopped 11/08/21 1003)    ED Course/ Medical Decision Making/ A&P                           Medical Decision Making  This patient presents to the ED for concern of unresponsive, this involves an extensive number of treatment options, and is a complaint that carries with it a high risk of  complications and morbidity.  The differential diagnosis includes CVA, ICH, drug overdose, trauma   Co morbidities that complicate the patient evaluation  Asthma, obesity   Additional history obtained:  Additional history obtained from [employer] officers, EMS External records from outside source obtained and reviewed including EMR   Lab Tests:  I Ordered, and personally interpreted labs.  The pertinent results include: No respiratory acidosis, normal blood glucose, no evidence of infection, no evidence of drug toxicity, normal troponin, nonspecific findings of leukocytosis and mildly elevated lactic acid.   Imaging Studies ordered:  I ordered imaging studies including CT head, chest x-ray I independently visualized and interpreted imaging which showed no acute intracranial abnormalities; stable diffuse coarse lung interstitial markings. I agree with the radiologist interpretation    Cardiac Monitoring:  The patient was maintained on a cardiac monitor.  I personally viewed and interpreted the cardiac monitored which showed an underlying rhythm of: Sinus tachycardia   Medicines ordered and prescription drug management:  I ordered medication including RSI medications for intubation; IV fluids for tachycardia; inline albuterol, Solu-Medrol for treatment of wheezing in patient with known asthma Reevaluation of the patient after these medicines showed that the patient improved I have reviewed the patients home medicines and have made adjustments as needed   Test Considered:  CTA chest.  This was unable to be obtained with CTA of head and neck due to contrast timing.  Noncontrasted CT scan of chest ordered instead.   Critical Interventions:  Intubation for airway protection and respiratory distress   Consultations Obtained:  I requested consultation with the neurology,  and discussed lab and imaging findings as well as pertinent plan - they recommend: CTA head and  neck   Problem List / ED Course:  Patient is a 46 year old male presenting from jail for unresponsiveness of unknown cause.  History is provided by EMS as well as Sheriff's deputies.  He is reportedly a model inmate and not a known drug user.  There was no suspicion of trauma.  Patient was reportedly seen normal 1.5 hours prior to being found unresponsive in his bed.  On arrival, patient has a GCS of 6.  His breathing is intermittent and there is diffuse wheezing throughout his lung fields on auscultation.  He is maintaining normal blood pressure and normal SPO2.  Patient underwent emergent intubation.  Following this, CT scan of head was obtained.  There was no evidence of intracranial hemorrhage or large vessel occlusion.  Etiology of his condition remains unclear.  He does not  have evidence of seizure activity but patient could be suffering from subclinical seizures.  Post intubation sedation was provided with propofol and Versed, which will also treat any subclinical seizures.  I did discuss with neurology regarding patient's presentation and unknown cause.  They did recommend a CTA of head and neck to assess for brainstem CVA.  I spoke with critical care at [facility], Dr. Loanne Drilling who accepts the patient for admission.  Patient remained hemodynamically stable in the ED.  He was transferred to [facility] in stable condition.   Reevaluation:  After the interventions noted above, I reevaluated the patient and found that they have :stayed the same   Social Determinants of Health:  Currently incarcerated   Dispostion:  After consideration of the diagnostic results and the patients response to treatment, I feel that the patent would benefit from admission to ICU at [place] Performed by: Godfrey Pick   Total critical care time: 40 minutes  Critical care time was exclusive of separately billable procedures and treating other patients.  Critical care was  necessary to treat or prevent imminent or life-threatening deterioration.  Critical care was time spent personally by me on the following activities: development of treatment plan with patient and/or surrogate as well as nursing, discussions with consultants, evaluation of patient's response to treatment, examination of patient, obtaining history from patient or surrogate, ordering and performing treatments and interventions, ordering and review of laboratory studies, ordering and review of radiographic studies, pulse oximetry and re-evaluation of patient's condition.         Final Clinical Impression(s) / ED Diagnoses Final diagnoses:  Unresponsive  Glasgow coma scale total score 3-8, in the field (EMT or ambulance) [facility])  Respiratory distress    Rx / DC Orders ED Discharge Orders     None         Godfrey Pick, MD 11/08/21 1548

## 2021-11-08 ENCOUNTER — Other Ambulatory Visit: Payer: Self-pay

## 2021-11-08 ENCOUNTER — Inpatient Hospital Stay (HOSPITAL_COMMUNITY)

## 2021-11-08 DIAGNOSIS — J9691 Respiratory failure, unspecified with hypoxia: Secondary | ICD-10-CM

## 2021-11-08 DIAGNOSIS — R4189 Other symptoms and signs involving cognitive functions and awareness: Secondary | ICD-10-CM | POA: Diagnosis not present

## 2021-11-08 DIAGNOSIS — R4182 Altered mental status, unspecified: Secondary | ICD-10-CM | POA: Diagnosis present

## 2021-11-08 DIAGNOSIS — G9340 Encephalopathy, unspecified: Principal | ICD-10-CM

## 2021-11-08 LAB — BLOOD CULTURE ID PANEL (REFLEXED) - BCID2

## 2021-11-08 LAB — COMPREHENSIVE METABOLIC PANEL
ALT: 25 U/L (ref 0–44)
AST: 19 U/L (ref 15–41)
Albumin: 3.3 g/dL — ABNORMAL LOW (ref 3.5–5.0)
Alkaline Phosphatase: 65 U/L (ref 38–126)
Anion gap: 9 (ref 5–15)
BUN: 9 mg/dL (ref 6–20)
CO2: 23 mmol/L (ref 22–32)
Calcium: 9 mg/dL (ref 8.9–10.3)
Chloride: 104 mmol/L (ref 98–111)
Creatinine, Ser: 1.03 mg/dL (ref 0.61–1.24)
GFR, Estimated: >60 mL/min (ref >60)
Glucose, Bld: 146 mg/dL — ABNORMAL HIGH (ref 70–99)
Potassium: 4.2 mmol/L (ref 3.5–5.1)
Sodium: 136 mmol/L (ref 135–145)
Total Bilirubin: 0.7 mg/dL (ref 0.3–1.2)
Total Protein: 6.4 g/dL — ABNORMAL LOW (ref 6.5–8.1)

## 2021-11-08 LAB — CBC
HCT: 43.8 % (ref 39.0–52.0)
Hemoglobin: 14.4 g/dL (ref 13.0–17.0)
MCH: 30.6 pg (ref 26.0–34.0)
MCHC: 32.9 g/dL (ref 30.0–36.0)
MCV: 93.2 fL (ref 80.0–100.0)
Platelets: 275 10*3/uL (ref 150–400)
RBC: 4.7 MIL/uL (ref 4.22–5.81)
RDW: 15.2 % (ref 11.5–15.5)
WBC: 15.7 10*3/uL — ABNORMAL HIGH (ref 4.0–10.5)
nRBC: 0 % (ref 0.0–0.2)

## 2021-11-08 LAB — GLUCOSE, CAPILLARY
Glucose-Capillary: 115 mg/dL — ABNORMAL HIGH (ref 70–99)
Glucose-Capillary: 128 mg/dL — ABNORMAL HIGH (ref 70–99)
Glucose-Capillary: 134 mg/dL — ABNORMAL HIGH (ref 70–99)
Glucose-Capillary: 140 mg/dL — ABNORMAL HIGH (ref 70–99)
Glucose-Capillary: 143 mg/dL — ABNORMAL HIGH (ref 70–99)
Glucose-Capillary: 156 mg/dL — ABNORMAL HIGH (ref 70–99)
Glucose-Capillary: 160 mg/dL — ABNORMAL HIGH (ref 70–99)

## 2021-11-08 LAB — AMMONIA: Ammonia: 19 umol/L (ref 9–35)

## 2021-11-08 LAB — MRSA NEXT GEN BY PCR, NASAL: MRSA by PCR Next Gen: NOT DETECTED

## 2021-11-08 LAB — HEMOGLOBIN A1C
Hgb A1c MFr Bld: 5.8 % — ABNORMAL HIGH (ref 4.8–5.6)
Mean Plasma Glucose: 119.76 mg/dL

## 2021-11-08 LAB — TSH: TSH: 1.031 u[IU]/mL (ref 0.350–4.500)

## 2021-11-08 LAB — HIV ANTIBODY (ROUTINE TESTING W REFLEX): HIV Screen 4th Generation wRfx: NONREACTIVE

## 2021-11-08 LAB — MAGNESIUM: Magnesium: 1.8 mg/dL (ref 1.7–2.4)

## 2021-11-08 MED ORDER — RACEPINEPHRINE HCL 2.25 % IN NEBU
INHALATION_SOLUTION | RESPIRATORY_TRACT | Status: AC
  Administered 2021-11-08: 0.5 mL
  Filled 2021-11-08: qty 1

## 2021-11-08 MED ORDER — FENTANYL CITRATE (PF) 100 MCG/2ML IJ SOLN
100.0000 ug | INTRAMUSCULAR | Status: DC | PRN
  Filled 2021-11-08: qty 2

## 2021-11-08 MED ORDER — INSULIN ASPART 100 UNIT/ML IJ SOLN
0.0000 [IU] | INTRAMUSCULAR | Status: DC
  Administered 2021-11-08: 2 [IU] via SUBCUTANEOUS
  Administered 2021-11-08 (×3): 3 [IU] via SUBCUTANEOUS
  Administered 2021-11-08 – 2021-11-09 (×2): 2 [IU] via SUBCUTANEOUS
  Administered 2021-11-09 (×2): 3 [IU] via SUBCUTANEOUS
  Administered 2021-11-09 – 2021-11-10 (×5): 2 [IU] via SUBCUTANEOUS
  Administered 2021-11-10: 5 [IU] via SUBCUTANEOUS
  Administered 2021-11-11: 3 [IU] via SUBCUTANEOUS
  Administered 2021-11-11: 2 [IU] via SUBCUTANEOUS
  Administered 2021-11-11: 3 [IU] via SUBCUTANEOUS
  Administered 2021-11-11 – 2021-11-13 (×3): 2 [IU] via SUBCUTANEOUS
  Administered 2021-11-13: 3 [IU] via SUBCUTANEOUS

## 2021-11-08 MED ORDER — MAGNESIUM SULFATE 2 GM/50ML IV SOLN
2.0000 g | Freq: Once | INTRAVENOUS | Status: AC
  Administered 2021-11-08: 2 g via INTRAVENOUS
  Filled 2021-11-08: qty 50

## 2021-11-08 MED ORDER — METHYLPREDNISOLONE SODIUM SUCC 40 MG IJ SOLR
40.0000 mg | INTRAMUSCULAR | Status: DC
  Administered 2021-11-08 – 2021-11-10 (×3): 40 mg via INTRAVENOUS
  Filled 2021-11-08 (×3): qty 1

## 2021-11-08 MED ORDER — DOCUSATE SODIUM 50 MG/5ML PO LIQD: 100.0000 mg | Freq: Two times a day (BID) | ORAL | Status: DC | PRN

## 2021-11-08 MED ORDER — DEXMEDETOMIDINE HCL IN NACL 400 MCG/100ML IV SOLN
0.4000 ug/kg/h | INTRAVENOUS | Status: DC
  Administered 2021-11-08: 0.5 ug/kg/h via INTRAVENOUS
  Filled 2021-11-08: qty 100

## 2021-11-08 MED ORDER — PROPOFOL 1000 MG/100ML IV EMUL
5.0000 ug/kg/min | INTRAVENOUS | Status: DC
  Administered 2021-11-08: 10 ug/kg/min via INTRAVENOUS
  Administered 2021-11-08: 35 ug/kg/min via INTRAVENOUS
  Administered 2021-11-08: 60 ug/kg/min via INTRAVENOUS
  Administered 2021-11-08: 30 ug/kg/min via INTRAVENOUS
  Filled 2021-11-08 (×3): qty 100

## 2021-11-08 MED ORDER — PANTOPRAZOLE SODIUM 40 MG IV SOLR
40.0000 mg | INTRAVENOUS | Status: DC
  Administered 2021-11-08 – 2021-11-09 (×2): 40 mg via INTRAVENOUS
  Filled 2021-11-08 (×3): qty 40

## 2021-11-08 MED ORDER — METHYLPREDNISOLONE SODIUM SUCC 125 MG IJ SOLR: 125.0000 mg | Freq: Once | INTRAMUSCULAR | Status: DC

## 2021-11-08 MED ORDER — BUDESONIDE 0.25 MG/2ML IN SUSP
0.2500 mg | Freq: Two times a day (BID) | RESPIRATORY_TRACT | Status: DC
  Administered 2021-11-08 (×2): 0.25 mg via RESPIRATORY_TRACT
  Filled 2021-11-08 (×2): qty 2

## 2021-11-08 MED ORDER — HYDRALAZINE HCL 20 MG/ML IJ SOLN
10.0000 mg | Freq: Four times a day (QID) | INTRAMUSCULAR | Status: DC | PRN
  Administered 2021-11-08 – 2021-11-09 (×3): 10 mg via INTRAVENOUS
  Filled 2021-11-08 (×2): qty 1

## 2021-11-08 MED ORDER — SODIUM CHLORIDE 0.9 % IV SOLN: INTRAVENOUS | Status: DC | PRN

## 2021-11-08 MED ORDER — LEVALBUTEROL HCL 0.63 MG/3ML IN NEBU
INHALATION_SOLUTION | RESPIRATORY_TRACT | Status: AC
  Administered 2021-11-08: 0.63 mg
  Filled 2021-11-08: qty 3

## 2021-11-08 MED ORDER — HEPARIN SODIUM (PORCINE) 5000 UNIT/ML IJ SOLN
5000.0000 [IU] | Freq: Three times a day (TID) | INTRAMUSCULAR | Status: DC
  Administered 2021-11-08 – 2021-11-14 (×19): 5000 [IU] via SUBCUTANEOUS
  Filled 2021-11-08 (×18): qty 1

## 2021-11-08 MED ORDER — METHYLPREDNISOLONE SODIUM SUCC 125 MG IJ SOLR
INTRAMUSCULAR | Status: AC
  Filled 2021-11-08: qty 2

## 2021-11-08 MED ORDER — ARFORMOTEROL TARTRATE 15 MCG/2ML IN NEBU
15.0000 ug | INHALATION_SOLUTION | Freq: Two times a day (BID) | RESPIRATORY_TRACT | Status: DC
  Administered 2021-11-08 – 2021-11-13 (×12): 15 ug via RESPIRATORY_TRACT
  Filled 2021-11-08 (×13): qty 2

## 2021-11-08 MED ORDER — DOCUSATE SODIUM 100 MG PO CAPS: 100.0000 mg | ORAL_CAPSULE | Freq: Two times a day (BID) | ORAL | Status: DC | PRN

## 2021-11-08 MED ORDER — CHLORHEXIDINE GLUCONATE 0.12% ORAL RINSE (MEDLINE KIT)
15.0000 mL | Freq: Two times a day (BID) | OROMUCOSAL | Status: DC
  Administered 2021-11-08: 15 mL via OROMUCOSAL

## 2021-11-08 MED ORDER — ORAL CARE MOUTH RINSE
15.0000 mL | OROMUCOSAL | Status: DC
  Administered 2021-11-08 (×7): 15 mL via OROMUCOSAL

## 2021-11-08 MED ORDER — METHYLPREDNISOLONE SODIUM SUCC 40 MG IJ SOLR
40.0000 mg | Freq: Once | INTRAMUSCULAR | Status: AC
  Administered 2021-11-08: 40 mg via INTRAVENOUS

## 2021-11-08 MED ORDER — POLYETHYLENE GLYCOL 3350 17 G PO PACK: 17.0000 g | PACK | Freq: Every day | ORAL | Status: DC | PRN

## 2021-11-08 MED ORDER — CHLORHEXIDINE GLUCONATE CLOTH 2 % EX PADS
6.0000 | MEDICATED_PAD | Freq: Every day | CUTANEOUS | Status: DC
  Administered 2021-11-08 – 2021-11-10 (×3): 6 via TOPICAL

## 2021-11-08 NOTE — Progress Notes (Signed)
Patients HR consistently above 130. When anxious patients HR will climb up to 150's-160's. Contacted provider about PRN meds to anxiety or agitation. MD wants to monitor for now and not give anything at this time.

## 2021-11-08 NOTE — Procedures (Signed)
Patient Name: Jeremiah Yates  MRN: [id number]  Epilepsy Attending: Charlsie Quest  Referring Physician/Provider: Dr Milon Dikes Date: 11/08/2021 Duration: 21.34 mins  Patient history: 46 year old with above past medical history brought in from his jail cell after being found unresponsive. EEG to evaluate for seizure  Level of alertness: Awake, asleep/sedated  AEDs during EEG study: Propofol  Technical aspects: This EEG study was done with scalp electrodes positioned according to the 10-20 International system of electrode placement. Electrical activity was acquired at a sampling rate of 500Hz  and reviewed with a high frequency filter of 70Hz  and a low frequency filter of 1Hz . EEG data were recorded continuously and digitally stored.   Description: The posterior dominant rhythm consists of 8 Hz activity of moderate voltage (25-35 uV) seen predominantly in posterior head regions, symmetric and reactive to eye opening and eye closing. As propofol was increased, EEG showed generalized 15-18hz  beta activity. Hyperventilation and photic stimulation were not performed.     ABNORMALITY - Excessive beta, generalized  IMPRESSION: This study is within normal limits. The excessive beta activity seen in the background is most likely due to the effect of medications like benzodiazepine and is a benign EEG pattern. No seizures or epileptiform discharges were seen throughout the recording.  Britani Beattie 

## 2021-11-08 NOTE — Progress Notes (Signed)
Patient's RR went into the 40s with HR in the 130s stating he is having a hard time catching his breath. Currently lung sounds are stridorous with a slight wheeze. RT and CCM called to the bedside. Solumedrol 40mg  once ordered along with racemic epi, nebulizer treatment, and saline mist mask. Will continue to monitor closely.

## 2021-11-08 NOTE — Progress Notes (Signed)
PHARMACY - PHYSICIAN COMMUNICATION CRITICAL VALUE ALERT - BLOOD CULTURE IDENTIFICATION (BCID)  Jeremiah Yates is an 46 y.o. male who presented to [facility] on 11/07/2021 with a chief complaint of being found down at jail  Assessment:  Pt with 1/4 blood culture bottles positive for strep species - likely contaminant.  Name of physician (or Provider) Contacted: Dr. Kendrick Fries  Current antibiotics: None  Changes to prescribed antibiotics recommended:  No additional abx at this time  Results for orders placed or performed during the hospital encounter of 11/07/21  Blood Culture ID Panel (Reflexed) (Collected: 11/07/2021  8:14 PM)  Result Value Ref Range   Enterococcus faecalis NOT DETECTED NOT DETECTED   Enterococcus Faecium NOT DETECTED NOT DETECTED   Listeria monocytogenes NOT DETECTED NOT DETECTED   Staphylococcus species NOT DETECTED NOT DETECTED   Staphylococcus aureus (BCID) NOT DETECTED NOT DETECTED   Staphylococcus epidermidis NOT DETECTED NOT DETECTED   Staphylococcus lugdunensis NOT DETECTED NOT DETECTED   Streptococcus species DETECTED (A) NOT DETECTED   Streptococcus agalactiae NOT DETECTED NOT DETECTED   Streptococcus pneumoniae NOT DETECTED NOT DETECTED   Streptococcus pyogenes NOT DETECTED NOT DETECTED   A.calcoaceticus-baumannii NOT DETECTED NOT DETECTED   Bacteroides fragilis NOT DETECTED NOT DETECTED   Enterobacterales NOT DETECTED NOT DETECTED   Enterobacter cloacae complex NOT DETECTED NOT DETECTED   Escherichia coli NOT DETECTED NOT DETECTED   Klebsiella aerogenes NOT DETECTED NOT DETECTED   Klebsiella oxytoca NOT DETECTED NOT DETECTED   Klebsiella pneumoniae NOT DETECTED NOT DETECTED   Proteus species NOT DETECTED NOT DETECTED   Salmonella species NOT DETECTED NOT DETECTED   Serratia marcescens NOT DETECTED NOT DETECTED   Haemophilus influenzae NOT DETECTED NOT DETECTED   Neisseria meningitidis NOT DETECTED NOT DETECTED   Pseudomonas aeruginosa NOT DETECTED NOT  DETECTED   Stenotrophomonas maltophilia NOT DETECTED NOT DETECTED   Candida albicans NOT DETECTED NOT DETECTED   Candida auris NOT DETECTED NOT DETECTED   Candida glabrata NOT DETECTED NOT DETECTED   Candida krusei NOT DETECTED NOT DETECTED   Candida parapsilosis NOT DETECTED NOT DETECTED   Candida tropicalis NOT DETECTED NOT DETECTED   Cryptococcus neoformans/gattii NOT DETECTED NOT DETECTED    Christoper Fabian, PharmD, BCPS Please see amion for complete clinical pharmacist phone list 11/08/2021  7:46 PM

## 2021-11-08 NOTE — Plan of Care (Signed)
°  Problem: Respiratory: Goal: Ability to maintain a clear airway and adequate ventilation will improve 11/08/2021 0439 by Lenis Noon, RN Outcome: Not Progressing 11/08/2021 0439 by Lenis Noon, RN Outcome: Not Progressing   Problem: Activity: Goal: Ability to tolerate increased activity will improve 11/08/2021 0439 by Lenis Noon, RN Outcome: Progressing 11/08/2021 0439 by Lenis Noon, RN Outcome: Not Progressing

## 2021-11-08 NOTE — Progress Notes (Signed)
Pt transport to ct again with no complication and back pt tolerated well

## 2021-11-08 NOTE — Progress Notes (Signed)
PCCM Interval Note  Called to evaluate patient for tachypnea, mild respiratory distress, stridor.  He was extubated earlier today, originally intubated due to unresponsiveness.  Carries a history of asthma but no wheezing reported post intubation on review of his H&P, progress notes.  Not clear that this is an acute asthma exacerbation but he has been treated with bronchodilators, corticosteroids.   Vitals:   11/08/21 1530 11/08/21 1600 11/08/21 1625 11/08/21 1630  BP: 111/81 116/87  (!) 142/112  Pulse: 86 77 75 (!) 130  Resp: 17 17 16  (!) 24  Temp:      TempSrc:      SpO2: 97% 96% 99% 98%  Weight:      Height:      Well-developed young man, sitting up in bed.  Using some accessory muscles to breathe.  Respiratory rate 40s.  He has inspiratory and expiratory stridor with referred noise to the chest.  He is able to phonate, is able to cough although is not clearing his secretions completely.  Difficult to assess whether he has any lower airway wheezing.  Otherwise exam unremarkable.  Stridor postextubation.  Not clear that this is acute bronchospasm, in fact unclear whether asthma was driving his original decompensation as his wheezing appears to have resolved with intubation. -Extra dose of Solu-Medrol 40 mg now -Racemic epi neb now -Cool mist mask -We will need to follow him closely.  He is at risk for reintubation if he proves to continue to have compromise upper airway.    , MD, PhD 11/08/2021, 5:57 PM [facility] [phone] or if no answer before 7:00PM call [phone] For any issues after 7:00PM please call eLink [phone]

## 2021-11-08 NOTE — Progress Notes (Signed)
Patient arrived to [facility] icu via carelink.  Transferred to bed without incident.  Dr Everardo All at bedside.

## 2021-11-08 NOTE — Progress Notes (Signed)
eLink Physician-Brief Progress Note Patient Name: Jeremiah Yates DOB: [birth year] MRN: [id number]   Date of Service  11/08/2021  HPI/Events of Note  64 M inmate, history of hypertension, asthma, nephrolithiasis, brought in after being found unresponsive in jail cell. No improvement with Narcan. Intubated at [facility] ED. Head and neck CTA negative. Non contrast chest CT negative. Tox screen positive only for benzos which was reportedly given during intubation  eICU Interventions  CCM team seen at bedside Inform eLink if MRI or EEG is done tonight     Intervention Category Evaluation Type: New Patient Evaluation  Darl Pikes 11/08/2021, 1:34 AM

## 2021-11-08 NOTE — ED Notes (Signed)
Carelink picking up pt

## 2021-11-08 NOTE — Progress Notes (Signed)
Gave patient ice chips patient did well with swallowing at this time. No coughing noted.

## 2021-11-08 NOTE — Consult Note (Signed)
Neurology Consultation  Reason for Consult: unresponsiveness Referring Physician: Dr Loanne Drilling, PCCM  CC: unresponsiveness  History is obtained from:chart  HPI: Jeremiah Yates is a 46 y.o. male inmate of a prison, with PMH of HTN, kidney stones, who was brought in [place] from jail when he was found unresponsive in his cell.  To the best of history taking last known well was at 5 PM on 11/07/2021.  No prior known history of drug abuse.  Narcan was administered without much avail.  GCS 3-patient intubated emergently at [facility], ED. I was contacted over the phone and recommended that a CTA be done to rule out a basilar thrombus and MRI and EEG be considered. Patient was transferred to [facility] ICU for further management. Patient unable to provide any history No family at bedside No reported loss of pulse. No reported CPR performed. No reported or witnessed seizure.  Last hospital discharge note from 2013 for asymptomatic diverticulitis.  LKW: 5 PM 11/07/2021 tpa given?: no, by the time of my examination, outside the window, preliminary evaluation done by the ED providers at [facility] modified Rankin scale (mRS): 0 as reported by the facility official accompanying the patient  ROS: Unable to obtain due to altered mental status.   Past Medical History:  Diagnosis Date   Hypertension    Kidney stones    Kidney stones    Family History  Problem Relation Age of Onset   Breast cancer Mother        alive at age 67   Lung cancer Father        alive at age 53   Social History:   reports that he has never smoked. He has never used smokeless tobacco. He reports current alcohol use of about 1.0 standard drink per week. He reports that he does not use drugs.  Medications  Current Facility-Administered Medications:    chlorhexidine gluconate (MEDLINE KIT) (PERIDEX) 0.12 % solution 15 mL, 15 mL, Mouth Rinse, BID, Margaretha Seeds, MD   Chlorhexidine Gluconate Cloth 2 %  PADS 6 each, 6 each, Topical, Daily, Margaretha Seeds, MD, 6 each at 11/08/21 0134   fentaNYL (SUBLIMAZE) injection 100 mcg, 100 mcg, Intravenous, Q1H PRN, Godfrey Pick, MD   MEDLINE mouth rinse, 15 mL, Mouth Rinse, 10 times per day, Margaretha Seeds, MD  Exam: Current vital signs: BP (!) 150/117 (BP Location: Left Arm)    Pulse (!) 107    Temp (!) 96.8 F (36 C) (Bladder)    Resp 17    Ht 5' 8"  (1.727 m)    Wt 95.7 kg    SpO2 99%    BMI 32.08 kg/m  Vital signs in last 24 hours: Temp:  [94.5 F (34.7 C)-98.1 F (36.7 C)] 96.8 F (36 C) (01/11 0132) Pulse Rate:  [97-126] 107 (01/11 0132) Resp:  [10-24] 17 (01/11 0132) BP: (92-199)/(71-145) 150/117 (01/11 0132) SpO2:  [95 %-100 %] 99 % (01/11 0132) FiO2 (%):  [40 %] 40 % (01/11 0120) Weight:  [95.7 kg] 95.7 kg (01/11 0132) General: Intubated and sedated with propofol.  Propofol was held for about 10 minutes for this exam. HEENT: Normocephalic, atraumatic, ET tube in place, OG tube in place draining dark brown fluid, no neck stiffness CVS: Regular rate rhythm Respiration: Vented Abdomen nondistended nontender Extremities warm well perfused  Neurological exam Intubated Sedated with propofol which was held for exam No spontaneous movements Pupils equal round reactive to light Corneals present Breathing over  the ventilator To noxious stimulation, localizes with both upper extremities. To noxious simulation to lower extremities, brisk withdrawal.   Labs I have reviewed labs in epic and the results pertinent to this consultation are: CBC    Component Value Date/Time   WBC 14.6 (H) 11/07/2021 1940   RBC 4.95 11/07/2021 1940   HGB 15.6 11/07/2021 1940   HCT 47.8 11/07/2021 1940   PLT 278 11/07/2021 1940   MCV 96.6 11/07/2021 1940   MCH 31.5 11/07/2021 1940   MCHC 32.6 11/07/2021 1940   RDW 15.4 11/07/2021 1940   LYMPHSABS 2.0 11/07/2021 1940   MONOABS 0.6 11/07/2021 1940   EOSABS 0.2 11/07/2021 1940   BASOSABS 0.1  11/07/2021 1940    CMP     Component Value Date/Time   NA 137 11/07/2021 1940   K 3.8 11/07/2021 1940   CL 104 11/07/2021 1940   CO2 22 11/07/2021 1940   GLUCOSE 178 (H) 11/07/2021 1940   BUN 13 11/07/2021 1940   CREATININE 1.12 11/07/2021 1940   CALCIUM 9.0 11/07/2021 1940   PROT 7.7 11/07/2021 1940   ALBUMIN 4.1 11/07/2021 1940   AST 21 11/07/2021 1940   ALT 29 11/07/2021 1940   ALKPHOS 73 11/07/2021 1940   BILITOT 0.6 11/07/2021 1940   GFRNONAA >60 11/07/2021 1940   GFRAA >90 06/04/2012 0428   Toxicology screen positive for benzodiazepines which she was given for intubation presumably  Imaging I have reviewed the images obtained: CT-head-no acute findings CTA head and neck with no emergent LVO.  Patent basilar. CT chest without contrast-no acute intrathoracic pathology  Results reviewed CXR: IMPRESSION: 1. Endotracheal tube with tip above the carina. 2. Enteric tube with tip in the body of the stomach. 3. Stable diffuse interstitial coarsening and nodularity similar to prior radiograph.  Assessment: 46 year old with above past medical history brought in from his jail cell after being found unresponsive. No response to Narcan.  GCS 3.  Intubated. On examination he is localizing b/l when taken off of propofol. Unclear cause of unresponsiveness. No witnessed seizures and no loss of pulses reported.  Recommendations: Minimize sedation as tolerated and repeat exams Wean off the vent as tolerated MRI brain w/o contrast Routine EEG  Supportive care per primary team as you are Plan discussed with Dr. Loanne Drilling on the unit -- Amie Portland, MD Neurologist [facility] Pager: [phone]  [place] Performed by: Amie Portland, MD Total critical care time: 35 minutes Critical care time was exclusive of separately billable procedures and treating other patients and/or supervising APPs/Residents/Students Critical care was necessary to treat  or prevent imminent or life-threatening deterioration due to acute unresponsiveness of unclear etiology  This patient is critically ill and at significant risk for neurological worsening and/or death and care requires constant monitoring. Critical care was time spent personally by me on the following activities: development of treatment plan with patient and/or surrogate as well as nursing, discussions with consultants, evaluation of patient's response to treatment, examination of patient, obtaining history from patient or surrogate, ordering and performing treatments and interventions, ordering and review of laboratory studies, ordering and review of radiographic studies, pulse oximetry, re-evaluation of patient's condition, participation in multidisciplinary rounds and medical decision making of high complexity in the care of this patient.

## 2021-11-08 NOTE — Progress Notes (Signed)
EEG complete - results pending 

## 2021-11-08 NOTE — Progress Notes (Signed)
° °  NAME:  Jeremiah Yates, MRN:  [id number], DOB:  15-Jan-1976, LOS: 1 ADMISSION DATE:  11/07/2021, CONSULTATION DATE:  11/08/21 REFERRING MD:  Gloris Manchester, MD CHIEF COMPLAINT:  Altered mental status   History of Present Illness:  46 year old incarcerated male with history of asthma admitted 1/10 for unresponsiveness. Last normal 5PM that evening. Staff at facility denies recent illness or concerns. No recent asthma exacerbations. Unresponsive to Narcan.  Intubated at [facility] ED. CT head neg. CTA pending. ED physician requested transfer to [facility] for Neuro evaluation and imaging.  Pertinent  Medical History  Asthma, hx diverticular abscess  Significant Hospital Events: Including procedures, antibiotic start and stop dates in addition to other pertinent events   1/11 Transferred from [facility] ED to [facility]  Interim History / Subjective:  On 40 Propofol this AM due to trying to reach for ETT this AM. Was not following commands at the time. MRI pending this AM, scheduled for 1000.  Objective   Blood pressure 103/73, pulse 91, temperature (!) 97.5 F (36.4 C), temperature source Bladder, resp. rate 16, height 5\' 8"  (1.727 m), weight 95.7 kg, SpO2 96 %.    Vent Mode: PRVC FiO2 (%):  [40 %] 40 % Set Rate:  [16 bmp] 16 bmp Vt Set:  [510 mL-550 mL] 550 mL PEEP:  [5 cmH20] 5 cmH20 Plateau Pressure:  [13 cmH20-16 cmH20] 15 cmH20   Intake/Output Summary (Last 24 hours) at 11/08/2021 0708 Last data filed at 11/08/2021 0600 Gross per 24 hour  Intake 1143.5 ml  Output 960 ml  Net 183.5 ml    Filed Weights   11/08/21 0132  Weight: 95.7 kg   Physical Exam: General: Male laying in bed, sedated HENT: [place], AT, ETT in place Eyes: PERRL, no scleral icterus Respiratory: CTAB, no W/R/R Cardiovascular: RRR, no M/R/G GI: BS x 4, S/NT/ND Extremities: No deformities, no edema Neuro: Sedated. Does not follow commands.  Resolved Hospital Problem list   N/A  Assessment & Plan:   Acute encephalopathy -  unclear etiology. UDS +benzos however versed given with RSI.  CT/CTA negative, MRI/EEG pending. Ammonia, TSH normal. - F/u on MRI, EEG - Avoid sedating medications.  Acute hypoxemic respiratory failure secondary to above, airway protection. Hx Asthma - ? Exacerbation. - Full vent support until mental status improves. - Wean sedation to facilitate WUA and SBT. - Continue Steroids, BD's  Dark OG output - Hgb and platelets normal. - Monitor.  Best Practice (right click and "Reselect all SmartList Selections" daily)   Diet/type: NPO DVT prophylaxis: systemic heparin GI prophylaxis: PPI Lines: N/A Foley:  N/A Code Status:  full code Last date of multidisciplinary goals of care discussion [N/A]  Critical care time: 30 min.    01/06/22, PA - [facility] For pager details, please see AMION or use Epic chat  After 1900, please call ELINK for cross coverage needs 11/08/2021, 8:25 AM

## 2021-11-08 NOTE — Progress Notes (Signed)
PCCM Interval Progress Note  Agitation after EEG completed requiring staff to restrain pt and increase propofol.  Now on 60 Prop and getting somnolent again.  Will d/c prop and switch to Precedex. RN asked to titrate down to try and allow for mental status assessment in hopes we can extubate him later today.   Rutherford Guys, PA - [facility] [place] [facility] For pager details, please see AMION or use Epic chat  After 1900, please call [facility] for cross coverage needs 11/08/2021, 2:31 PM

## 2021-11-08 NOTE — H&P (Signed)
NAME:  Jeremiah Yates, MRN:  [id number], DOB:  1976-05-03, LOS: 1 ADMISSION DATE:  11/07/2021, CONSULTATION DATE:  11/08/21 REFERRING MD:  Godfrey Pick, MD CHIEF COMPLAINT:  Altered mental status   History of Present Illness:  46 year old incarcerated male with history of asthma admitted for unresponsiveness. Last normal 5PM. Staff at facility denies recent illness or concerns. No recent asthma exacerbations. Unresponsive to Narcan.  Intubated at [facility] ED. CT head neg. CTA pending. ED physician requested transfer to [facility] for Neuro evaluation and imaging.  Pertinent  Medical History  Asthma, hx diverticular abscess  Significant Hospital Events: Including procedures, antibiotic start and stop dates in addition to other pertinent events   1/11 Transferred from [facility] ED to [facility]  Interim History / Subjective:  As above  Objective   Blood pressure (!) 150/117, pulse (!) 107, temperature (!) 96.8 F (36 C), temperature source Bladder, resp. rate 17, height 5\' 8"  (1.727 m), weight 95.7 kg, SpO2 99 %.    Vent Mode: PRVC FiO2 (%):  [40 %] 40 % Set Rate:  [16 bmp] 16 bmp Vt Set:  [510 mL-550 mL] 550 mL PEEP:  [5 cmH20] 5 cmH20 Plateau Pressure:  [16 cmH20] 16 cmH20   Intake/Output Summary (Last 24 hours) at 11/08/2021 0136 Last data filed at 11/08/2021 0030 Gross per 24 hour  Intake 1100 ml  Output 450 ml  Net 650 ml   Filed Weights   11/08/21 0132  Weight: 95.7 kg   Physical Exam: General: Male laying in bed, sedated HENT: [place], AT, ETT in place Eyes: PERRL, no scleral icterus Respiratory: Clear to auscultation bilaterally.  No crackles, wheezing or rales Cardiovascular: RRR, -M/R/G, no JVD GI: BS+, soft, nontender Extremities:-Edema,-tenderness Neuro: Sedated. Withdraws to noxious stimuli x 4   Resolved Hospital Problem list   N/A  Assessment & Plan:   Acute encephalopathy, unclear etiology UDS +benzos however versed given with RSI --Neuro consulted. CT/CTA head neg.  Recommend MRI, EEG --Check ammonia, TSH  Acute hypoxemic respiratory failure secondary to above, airway protection ?Asthma exacerbation --Full vent support. On minimal vent settings --SBT/WUA daily. Extubation precluded by mental status --VAP --Steroids --Bronchodilators   Dark OG output --Trend CBC --Hold TF  Best Practice (right click and "Reselect all SmartList Selections" daily)   Diet/type: NPO DVT prophylaxis: systemic heparin GI prophylaxis: PPI Lines: N/A Foley:  N/A Code Status:  full code Last date of multidisciplinary goals of care discussion [N/A]  Labs   CBC: Recent Labs  Lab 11/07/21 1940  WBC 14.6*  NEUTROABS 11.6*  HGB 15.6  HCT 47.8  MCV 96.6  PLT 0000000    Basic Metabolic Panel: Recent Labs  Lab 11/07/21 1940  NA 137  K 3.8  CL 104  CO2 22  GLUCOSE 178*  BUN 13  CREATININE 1.12  CALCIUM 9.0  MG 1.9   GFR: Estimated Creatinine Clearance: 95.4 mL/min (by C-G formula based on SCr of 1.12 mg/dL). Recent Labs  Lab 11/07/21 1940 11/07/21 2014  WBC 14.6*  --   LATICACIDVEN  --  2.0*    Liver Function Tests: Recent Labs  Lab 11/07/21 1940  AST 21  ALT 29  ALKPHOS 73  BILITOT 0.6  PROT 7.7  ALBUMIN 4.1   No results for input(s): LIPASE, AMYLASE in the last 168 hours. No results for input(s): AMMONIA in the last 168 hours.  ABG    Component Value Date/Time   PHART 7.344 (L) 11/07/2021 2130   PCO2ART  40.9 11/07/2021 2130   PO2ART 75.5 (L) 11/07/2021 2130   HCO3 21.5 11/07/2021 2130   TCO2 25 06/01/2012 1951   ACIDBASEDEF 3.0 (H) 11/07/2021 2130   O2SAT 94.8 11/07/2021 2130     Coagulation Profile: No results for input(s): INR, PROTIME in the last 168 hours.  Cardiac Enzymes: No results for input(s): CKTOTAL, CKMB, CKMBINDEX, TROPONINI in the last 168 hours.  HbA1C: No results found for: HGBA1C  CBG: Recent Labs  Lab 11/07/21 2057 11/08/21 0129  GLUCAP 146* 115*    Review of Systems:   Unable to  obtain  Past Medical History:  He,  has a past medical history of Hypertension, Kidney stones, and Kidney stones.   Surgical History:   Past Surgical History:  Procedure Laterality Date   [place]     Social History:   reports that he has never smoked. He has never used smokeless tobacco. He reports current alcohol use of about 1.0 standard drink per week. He reports that he does not use drugs.   Family History:  His family history includes Breast cancer in his mother; Lung cancer in his father.   Allergies No Known Allergies   Home Medications  Prior to Admission medications   Medication Sig Start Date End Date Taking? Authorizing Provider  ibuprofen (ADVIL) 600 MG tablet  07/25/17  Yes [provider]  albuterol (VENTOLIN HFA) 108 (90 Base) MCG/ACT inhaler Inhale into the lungs. 10/11/21   [provider]  doxycycline (VIBRA-TABS) 100 MG tablet Take by mouth. 10/11/21   [provider]  methylPREDNISolone (MEDROL DOSEPAK) 4 MG TBPK tablet Take by mouth. 10/11/21   [provider]  MUCUS RELIEF 600 MG 12 hr tablet Take by mouth. 08/15/21   [provider]  Tamsulosin HCl (FLOMAX) 0.4 MG CAPS Take 1 capsule (0.4 mg total) by mouth daily. 06/04/12   Samuella Cota, MD     Critical care time: 45 min     The patient is critically ill with multiple organ systems failure and requires high complexity decision making for assessment and support, frequent evaluation and titration of therapies, application of advanced monitoring technologies and extensive interpretation of multiple databases.    Rodman Pickle, M.D. [facility]monary/Critical Care Medicine 11/08/2021 1:36 AM   Please see Amion for pager number to reach on-call Pulmonary and Critical Care Team.

## 2021-11-08 NOTE — Progress Notes (Signed)
Contacted E-Link in regardssto patients BP. Patient does have a history of HTN. Current BP after recycle is 152/102 with MAP of 117. Requesting PRN for BP control.

## 2021-11-08 NOTE — Progress Notes (Addendum)
Spoke to RT in regards to possibly switching patient from cool mask to nasal cannula 4L. Patient saturations 98% some stridor heard at this time upon auscultation of lung sounds, however extensive respiratory wheezes. Will leave mask at this time and monitor patient. Patient is calm and cooperating.

## 2021-11-08 NOTE — Progress Notes (Signed)
[facility] PCCM  Awake, agitated Oxygenation acceptable Extubate  Heber Levering, MD [place] [facility] Pager: [phone] Cell: [phone] After 7:00 pm call Elink  [phone]

## 2021-11-09 ENCOUNTER — Inpatient Hospital Stay (HOSPITAL_COMMUNITY)

## 2021-11-09 ENCOUNTER — Encounter (HOSPITAL_COMMUNITY): Payer: Self-pay | Admitting: Pulmonary Disease

## 2021-11-09 DIAGNOSIS — R4189 Other symptoms and signs involving cognitive functions and awareness: Secondary | ICD-10-CM

## 2021-11-09 LAB — TRIGLYCERIDES: Triglycerides: 126 mg/dL (ref <150)

## 2021-11-09 LAB — BASIC METABOLIC PANEL
Anion gap: 9 (ref 5–15)
BUN: 17 mg/dL (ref 6–20)
CO2: 24 mmol/L (ref 22–32)
Calcium: 9.2 mg/dL (ref 8.9–10.3)
Chloride: 109 mmol/L (ref 98–111)
Creatinine, Ser: 1.16 mg/dL (ref 0.61–1.24)
GFR, Estimated: >60 mL/min (ref >60)
Glucose, Bld: 145 mg/dL — ABNORMAL HIGH (ref 70–99)
Potassium: 4.2 mmol/L (ref 3.5–5.1)
Sodium: 142 mmol/L (ref 135–145)

## 2021-11-09 LAB — POCT I-STAT 7, (LYTES, BLD GAS, ICA,H+H)
Acid-Base Excess: 2 mmol/L (ref 0.0–2.0)
Bicarbonate: 26 mmol/L (ref 20.0–28.0)
Calcium, Ion: 1.32 mmol/L (ref 1.15–1.40)
HCT: 44 % (ref 39.0–52.0)
Hemoglobin: 15 g/dL (ref 13.0–17.0)
O2 Saturation: 92 %
Patient temperature: 98.1
Potassium: 4.3 mmol/L (ref 3.5–5.1)
Sodium: 144 mmol/L (ref 135–145)
TCO2: 27 mmol/L (ref 22–32)
pCO2 arterial: 37.8 mmHg (ref 32.0–48.0)
pH, Arterial: 7.445 (ref 7.350–7.450)
pO2, Arterial: 59 mmHg — ABNORMAL LOW (ref 83.0–108.0)

## 2021-11-09 LAB — GLUCOSE, CAPILLARY
Glucose-Capillary: 143 mg/dL — ABNORMAL HIGH (ref 70–99)
Glucose-Capillary: 138 mg/dL — ABNORMAL HIGH (ref 70–99)
Glucose-Capillary: 144 mg/dL — ABNORMAL HIGH (ref 70–99)
Glucose-Capillary: 172 mg/dL — ABNORMAL HIGH (ref 70–99)
Glucose-Capillary: 171 mg/dL — ABNORMAL HIGH (ref 70–99)
Glucose-Capillary: 126 mg/dL — ABNORMAL HIGH (ref 70–99)

## 2021-11-09 LAB — CBC
HCT: 45.9 % (ref 39.0–52.0)
Hemoglobin: 15.1 g/dL (ref 13.0–17.0)
MCH: 30.6 pg (ref 26.0–34.0)
MCHC: 32.9 g/dL (ref 30.0–36.0)
MCV: 93.1 fL (ref 80.0–100.0)
Platelets: 300 10*3/uL (ref 150–400)
RBC: 4.93 MIL/uL (ref 4.22–5.81)
RDW: 15.9 % — ABNORMAL HIGH (ref 11.5–15.5)
WBC: 20.2 10*3/uL — ABNORMAL HIGH (ref 4.0–10.5)
nRBC: 0 % (ref 0.0–0.2)

## 2021-11-09 LAB — PHOSPHORUS: Phosphorus: 4.7 mg/dL — ABNORMAL HIGH (ref 2.5–4.6)

## 2021-11-09 LAB — MAGNESIUM: Magnesium: 2.6 mg/dL — ABNORMAL HIGH (ref 1.7–2.4)

## 2021-11-09 MED ORDER — AMOXICILLIN-POT CLAVULANATE 875-125 MG PO TABS
1.0000 | ORAL_TABLET | Freq: Two times a day (BID) | ORAL | Status: AC
  Administered 2021-11-09 – 2021-11-13 (×10): 1 via ORAL
  Filled 2021-11-09 (×11): qty 1

## 2021-11-09 MED ORDER — ALBUTEROL SULFATE (2.5 MG/3ML) 0.083% IN NEBU
2.5000 mg | INHALATION_SOLUTION | RESPIRATORY_TRACT | Status: DC | PRN
  Administered 2021-11-10: 2.5 mg via RESPIRATORY_TRACT
  Filled 2021-11-09: qty 3

## 2021-11-09 MED ORDER — RACEPINEPHRINE HCL 2.25 % IN NEBU
0.5000 mL | INHALATION_SOLUTION | RESPIRATORY_TRACT | Status: DC | PRN
  Filled 2021-11-09: qty 0.5

## 2021-11-09 MED ORDER — ALBUTEROL SULFATE (2.5 MG/3ML) 0.083% IN NEBU
2.5000 mg | INHALATION_SOLUTION | RESPIRATORY_TRACT | Status: DC | PRN
  Administered 2021-11-09 (×2): 2.5 mg via RESPIRATORY_TRACT
  Filled 2021-11-09 (×2): qty 3

## 2021-11-09 MED ORDER — METHYLPREDNISOLONE SODIUM SUCC 40 MG IJ SOLR
40.0000 mg | Freq: Once | INTRAMUSCULAR | Status: AC
  Administered 2021-11-09: 40 mg via INTRAVENOUS
  Filled 2021-11-09: qty 1

## 2021-11-09 MED ORDER — BUDESONIDE 0.5 MG/2ML IN SUSP
0.5000 mg | Freq: Two times a day (BID) | RESPIRATORY_TRACT | Status: DC
  Administered 2021-11-09 – 2021-11-13 (×10): 0.5 mg via RESPIRATORY_TRACT
  Filled 2021-11-09 (×11): qty 2

## 2021-11-09 MED ORDER — HYDRALAZINE HCL 20 MG/ML IJ SOLN
10.0000 mg | INTRAMUSCULAR | Status: AC | PRN
  Administered 2021-11-09: 10 mg via INTRAVENOUS

## 2021-11-09 NOTE — Progress Notes (Signed)
NAME:  Jeremiah Yates, MRN:  [id number], DOB:  10-10-1976, LOS: 2 ADMISSION DATE:  11/07/2021, CONSULTATION DATE:  11/08/21 REFERRING MD:  Gloris Manchester, MD CHIEF COMPLAINT:  Altered mental status   History of Present Illness:  46 year old incarcerated male with history of asthma admitted 1/10 for unresponsiveness. Last normal 5PM that evening. Staff at facility denies recent illness or concerns. No recent asthma exacerbations. Unresponsive to Narcan.  Intubated at [facility] ED. CT head neg. CTA pending. ED physician requested transfer to [facility] for Neuro evaluation and imaging.  Pertinent  Medical History  Asthma, hx diverticular abscess  Significant Hospital Events: Including procedures, antibiotic start and stop dates in addition to other pertinent events   1/11 Transferred from [facility] ED to [facility] 1/11 extubated late afternoon.  Had some stridor thereafter, given racemic epi and extra dose solumedrol with improvement. 1/12 wheezing throughout but feels albuterol has been helping.  Giving extra dose solumedrol + racemic epi again. 1/12 started on Augmentin for 1/4 blood cultures growing Strep  Interim History / Subjective:  Extubated successfully.  Diffuse expiratory wheezing throughout.  He feels that albuterol has helped. Adamantly denies smoking or smoke exposure, drug use. EEG yesterday negative.  Objective   Blood pressure (!) 118/104, pulse (!) 126, temperature 98.1 F (36.7 C), temperature source Oral, resp. rate (!) 24, height 5\' 8"  (1.727 m), weight 82.7 kg, SpO2 90 %.    Vent Mode: PSV FiO2 (%):  [28 %-40 %] 35 % Set Rate:  [16 bmp] 16 bmp Vt Set:  [550 mL] 550 mL PEEP:  [5 cmH20] 5 cmH20 Pressure Support:  [5 cmH20] 5 cmH20 Plateau Pressure:  [13 cmH20] 13 cmH20   Intake/Output Summary (Last 24 hours) at 11/09/2021 0801 Last data filed at 11/08/2021 2344 Gross per 24 hour  Intake 258.79 ml  Output 1510 ml  Net -1251.21 ml    Filed Weights   11/08/21 0132 11/09/21 0421   Weight: 95.7 kg 82.7 kg   Physical Exam: General: Male laying in bed, watching TV, in NAD HENT: [place], AT, PRB in place Eyes: PERRL, no scleral icterus Respiratory: Diffuse wheezing throughout, some mild stridor.  Normal work of breathing Cardiovascular: RRR, no M/R/G GI: BS x 4, S/NT/ND Extremities: No deformities, no edema Neuro: A&O x 3, no deficits  Resolved Hospital Problem list   N/A  Assessment & Plan:   Acute encephalopathy - unclear etiology. UDS +benzos however versed given with RSI.  CT/CTA, EEG, MRI all negative. Ammonia, TSH normal.  Encephalopathy now resolved and he is extubated. - Continue supportive care. - Avoid sedating medications.  Acute hypoxemic respiratory failure secondary to above, airway protection - extubated 1/11 and tolerated well but continues to have wheezing and increased O2 needs (presumed 2/2 bronchospasm). - Continue supplemental O2 as needed to maintain SpO2 > 92%. - Continue steroids and give additional dose now. - Additional dose Racemic Epi neb now. - Continue BD's.  ? Strep bacteremia - 1/2 blood cultures with strep species and WBC up to 20.  Initially felt to be contaminant but with leukocytosis, possibly real. - Start Augmentin x 5 days. - Follow cultures through completion.  OK to transfer out to [facility].  Will ask [facility] to assume care in AM 1/13 with PCCM off.   Best Practice (right click and "Reselect all SmartList Selections" daily)   Diet/type: Regular consistency (see orders) DVT prophylaxis: prophylactic heparin  GI prophylaxis: PPI Lines: N/A Foley:  N/A Code Status:  full  code Last date of multidisciplinary goals of care discussion [N/A]  Critical care time: N/A    Rutherford Guys, PA - [facility] [place] For pager details, please see AMION or use Epic chat  After 1900, please call ELINK for cross coverage needs 11/09/2021, 8:01 AM

## 2021-11-09 NOTE — Progress Notes (Signed)
Contacted [facility] in regards to patients BP. Post hydralazine administration. Requesting something to help control BP level. Also patient appearing red in the face. O2 saturations up and down, patient denies difficulty breathing. Lung sounds with minimal expiratory wheezing. Will monitor patient closely.  Awaiting orders for BP.

## 2021-11-09 NOTE — Evaluation (Signed)
Clinical/Bedside Swallow Evaluation Patient Details  Name: Jeremiah Yates MRN: [id number] Date of Birth: 11-Jul-1976  Today's Date: 11/09/2021 Time: SLP Start Time (ACUTE ONLY): 1030 SLP Stop Time (ACUTE ONLY): 1047 SLP Time Calculation (min) (ACUTE ONLY): 17 min  Past Medical History:  Past Medical History:  Diagnosis Date   Asthma    Hypertension    Kidney stones    Kidney stones    Past Surgical History:  Past Surgical History:  Procedure Laterality Date   SPLENECTOMY  circa 1998   SPLENECTOMY  [id number]   HPI:  46 year old incarcerated male with history of asthma admitted for unresponsiveness. Last normal 5PM. Staff at facility denies recent illness or concerns. No recent asthma exacerbations. Unresponsive to Narcan.  Intubated at [facility] ED. CT head neg. CTA pending. ED physician requested transfer to [facility] for Neuro evaluation and imaging; 11/09/21 CXR indicated Cardiomegaly with central pulmonary vascular congestion.  2. New left basilar atelectasis/airspace disease.  3. New small left pleural effusion.  4. Endotracheal and nasogastric tubes have been removed; BSE generated d/t globus sensation reported.    Assessment / Plan / Recommendation  Clinical Impression  Pt seen for clinical swallowing evaluation post-extubation 11/08/21 with c/o globus sensation/"swelling in back throat."  Pt endorses adequate swallowing prior to hospitalization with restricted lingual frenulum noted during OME.  Otherwise, normal OME.  Pt assessed with various consistencies including thin via straw, puree and solids with a normal oropharyngeal swallow observed.  No overt s/sx of aspiration present throughout the evaluation.  Recommend initiating a regular/thin liquid diet with medications provided whole/liquids.  ST will s/o in this setting d/t pt tolerating regular/thin liquids without overt issues.  Thank you for this consult. SLP Visit Diagnosis: Dysphagia, unspecified (R13.10)    Aspiration Risk  No  limitations    Diet Recommendation   Regular/thin liquids  Medication Administration: Whole meds with liquid    Other  Recommendations Oral Care Recommendations: Oral care BID    Recommendations for follow up therapy are one component of a multi-disciplinary discharge planning process, led by the attending physician.  Recommendations may be updated based on patient status, additional functional criteria and insurance authorization.  Follow up Recommendations No SLP follow up      Assistance Recommended at Discharge None  Functional Status Assessment    Frequency and Duration  (evaluation only)          Prognosis Prognosis for Safe Diet Advancement: Good      Swallow Study   General Date of Onset: 11/07/21 HPI: 46 year old incarcerated male with history of asthma admitted for unresponsiveness. Last normal 5PM. Staff at facility denies recent illness or concerns. No recent asthma exacerbations. Unresponsive to Narcan.  Intubated at [facility] ED. CT head neg. CTA pending. ED physician requested transfer to [facility] for Neuro evaluation and imaging; 11/09/21 CXR indicated Cardiomegaly with central pulmonary vascular congestion.  2. New left basilar atelectasis/airspace disease.  3. New small left pleural effusion.  4. Endotracheal and nasogastric tubes have been removed; BSE generated d/t globus sensation reported. Type of Study: Bedside Swallow Evaluation Previous Swallow Assessment: Passed Yale swallow screen Diet Prior to this Study: Thin liquids Temperature Spikes Noted: No Respiratory Status: Nasal cannula (2L) History of Recent Intubation: Yes Length of Intubations (days): 1 days Date extubated: 11/08/21 Behavior/Cognition: Alert;Cooperative Oral Cavity Assessment: Within Functional Limits;Other (comment) (retricted lingual frenulum; adequate for swallow function) Oral Care Completed by SLP: Recent completion by staff Oral Cavity - Dentition: Adequate  natural dentition Vision:  Functional for self-feeding Self-Feeding Abilities: Able to feed self Patient Positioning: Upright in bed Baseline Vocal Quality: Normal Volitional Cough: Strong Volitional Swallow: Able to elicit    Oral/Motor/Sensory Function Overall Oral Motor/Sensory Function: Other (comment) (Within functional limits with exeption of restricted lingual frenulum, but adequate for swallow function)   Ice Chips Ice chips: Not tested   Thin Liquid Thin Liquid: Within functional limits Presentation: Straw;Cup    Nectar Thick Nectar Thick Liquid: Not tested   Honey Thick Honey Thick Liquid: Not tested   Puree Puree: Within functional limits Presentation: Self Fed   Solid     Solid: Within functional limits Presentation: Self Fed      Tressie Stalker, M.S., CCC-SLP 11/09/2021,11:29 AM

## 2021-11-09 NOTE — Evaluation (Signed)
Physical Therapy Evaluation Patient Details Name: Jeremiah Yates MRN: [id number] DOB: 24-Dec-1975 Today's Date: 11/09/2021  History of Present Illness  [facility] ED on 1/10. Extubated 1/11 but with continued wheezing. Acute encephalopathy of unknown etiology. PMH - asthma.  Clinical Impression  Pt presents to PT with slightly unsteady gait due to illness and inactivity. Expect pt will make good progress back to baseline with mobility. Will follow acutely but doubt pt will need PT after DC.         Recommendations for follow up therapy are one component of a multi-disciplinary discharge planning process, led by the attending physician.  Recommendations may be updated based on patient status, additional functional criteria and insurance authorization.  Follow Up Recommendations No PT follow up    Assistance Recommended at Discharge PRN  Patient can return home with the following  Other (comment) (no assist)    Equipment Recommendations None recommended by PT  Recommendations for Other Services       Functional Status Assessment Patient has had a recent decline in their functional status and demonstrates the ability to make significant improvements in function in a reasonable and predictable amount of time.     Precautions / Restrictions Precautions Precautions: Other (comment) Precaution Comments: Hydrographic surveyor to unlock ankle cuff Restrictions Weight Bearing Restrictions: No      Mobility  Bed Mobility Overal bed mobility: Modified Independent                  Transfers Overall transfer level: Needs assistance Equipment used: None Transfers: Sit to/from Stand Sit to Stand: Supervision           General transfer comment: Assist for safety    Ambulation/Gait Ambulation/Gait assistance: Min guard;Supervision Gait Distance (Feet): 150 Feet Assistive device: None Gait Pattern/deviations: Step-through  pattern;Decreased stride length;Wide base of support Gait velocity: decr Gait velocity interpretation: 1.31 - 2.62 ft/sec, indicative of limited community ambulator   General Gait Details: Initially min guard with pt slightly hesitant/guarded. Progressed to supervision for safety.  Stairs            Wheelchair Mobility    Modified Rankin (Stroke Patients Only)       Balance Overall balance assessment: Mild deficits observed, not formally tested                                           Pertinent Vitals/Pain Pain Assessment: No/denies pain    Home Living Family/patient expects to be discharged to:: [facility]                        Prior Function Prior Level of Function : Independent/Modified Independent             Mobility Comments: No assistive device       Hand Dominance        Extremity/Trunk Assessment   Upper Extremity Assessment Upper Extremity Assessment: Defer to OT evaluation    Lower Extremity Assessment Lower Extremity Assessment: Generalized weakness       Communication   Communication: No difficulties  Cognition Arousal/Alertness: Awake/alert Behavior During Therapy: Flat affect Overall Cognitive Status: Within Functional Limits for tasks assessed  General Comments General comments (skin integrity, edema, etc.): Pt reports dizziness on standing. Returned to sitting with BP 140's/110's. HR 130's with amb. SpO2 93% on 2L with amb    Exercises     Assessment/Plan    PT Assessment Patient needs continued PT services  PT Problem List Decreased strength;Decreased mobility       PT Treatment Interventions Gait training;Functional mobility training;Therapeutic activities;Therapeutic exercise;Patient/family education    PT Goals (Current goals can be found in the Care Plan section)  Acute Rehab PT Goals PT Goal Formulation: With  patient Time For Goal Achievement: 11/16/21 Potential to Achieve Goals: Good    Frequency Min 3X/week     Co-evaluation               AM-PAC PT "6 Clicks" Mobility  Outcome Measure Help needed turning from your back to your side while in a flat bed without using bedrails?: None Help needed moving from lying on your back to sitting on the side of a flat bed without using bedrails?: None Help needed moving to and from a bed to a chair (including a wheelchair)?: A Little Help needed standing up from a chair using your arms (e.g., wheelchair or bedside chair)?: A Little Help needed to walk in hospital room?: A Little Help needed climbing 3-5 steps with a railing? : A Little 6 Click Score: 20    End of Session Equipment Utilized During Treatment: Oxygen Activity Tolerance: Patient tolerated treatment well Patient left: in bed;with call bell/phone within reach;with bed alarm set   PT Visit Diagnosis: Unsteadiness on feet (R26.81);Other abnormalities of gait and mobility (R26.89)    Time: 8299-3716 PT Time Calculation (min) (ACUTE ONLY): 18 min   Charges:   PT Evaluation $PT Eval Low Complexity: 1 Low          [facility] PT Acute Rehabilitation Services Pager [phone] Office [phone]   Angelina Ok [facility] 11/09/2021, 10:13 AM

## 2021-11-09 NOTE — Progress Notes (Signed)
Neurology Progress Note  S: He states he "has been better" but improved over yesterday. He denies use of IV drugs. + wheezing. No pain, no vision changes, no incontinence, no dysphagia, no weakness of a particular limb, just generalized weakness every now and then.  He c/o of a dull 6/10 HA which comes and goes. Only last for a few minutes and has not asked for medicine.   O: Current vital signs: BP (!) 143/113    Pulse (!) 122    Temp 98.1 F (36.7 C) (Oral)    Resp (!) 21    Ht 5\' 8"  (1.727 m)    Wt 82.7 kg    SpO2 90%    BMI 27.72 kg/m  Vital signs in last 24 hours: Temp:  [97.3 F (36.3 C)-98.1 F (36.7 C)] 98.1 F (36.7 C) (01/12 0717) Pulse Rate:  [75-139] 122 (01/12 0830) Resp:  [15-31] 21 (01/12 0830) BP: (82-154)/(62-113) 143/113 (01/12 0800) SpO2:  [86 %-100 %] 90 % (01/12 0830) FiO2 (%):  [28 %-40 %] 28 % (01/12 0758) Weight:  [82.7 kg] 82.7 kg (01/12 0421)  GENERAL:Well appearing male watching TV with officer at bedside. Awake, alert in NAD. HEENT: Normocephalic and atraumatic. LUNGS: Normal respiratory effort. No audible wheezing.  CV: RRR on tele.  Ext: warm.  NEURO:  Mental Status: Alert and oriented x4.  Speech/Language: speech is without aphasia or dysarthria.  Naming, repetition, fluency, and comprehension intact. Cranial Nerves:  II: PERRL. Visual fields full.  III, IV, VI: EOMI. Eyelids elevate symmetrically.  V: Sensation is intact to light touch and symmetrical to face.  VII: Smile is symmetrical.  VIII: hearing intact to voice. IX, X: Palate elevates symmetrically. Phonation is normal.  04-06-2000 shrug 5/5. XII: tongue is midline without fasciculations. Motor:  5/5 to all muscle groups tested.  Tone: is normal and bulk is normal. Sensation- Intact to light touch bilaterally. Extinction absent to DSS.    Coordination: FTN intact bilaterally, HKS: no ataxia in BLE (difficult to perform due to leg chains). No drift.  DTRs:  2+ throughout.  Gait-  deferred.  Medications  Current Facility-Administered Medications:    0.9 %  sodium chloride infusion, , Intravenous, PRN, [id number] B, MD, Stopped at 11/08/21 0925   albuterol (PROVENTIL) (2.5 MG/3ML) 0.083% nebulizer solution 2.5 mg, 2.5 mg, Nebulization, Q2H PRN, Desai, Rahul P, PA-C   amoxicillin-clavulanate (AUGMENTIN) 875-125 MG per tablet 1 tablet, 1 tablet, Oral, Q12H, McQuaid, Douglas B, MD   arformoterol (BROVANA) nebulizer solution 15 mcg, 15 mcg, Nebulization, BID, 01/06/22, MD, 15 mcg at 11/09/21 0758   budesonide (PULMICORT) nebulizer solution 0.5 mg, 0.5 mg, Nebulization, BID, Desai, Rahul P, PA-C, 0.5 mg at 11/09/21 0757   Chlorhexidine Gluconate Cloth 2 % PADS 6 each, 6 each, Topical, Daily, 01/07/22, MD, 6 each at 11/08/21 0134   docusate (COLACE) 50 MG/5ML liquid 100 mg, 100 mg, Per Tube, BID PRN, 01/06/22, MD   heparin injection 5,000 Units, 5,000 Units, Subcutaneous, Q8H, Luciano Cutter, MD, 5,000 Units at 11/09/21 0532   hydrALAZINE (APRESOLINE) injection 10 mg, 10 mg, Intravenous, Q6H PRN, 01/07/22, MD, 10 mg at 11/09/21 0452   insulin aspart (novoLOG) injection 0-15 Units, 0-15 Units, Subcutaneous, Q4H, McQuaid, Douglas B, MD, 2 Units at 11/09/21 0719   methylPREDNISolone sodium succinate (SOLU-MEDROL) 40 mg/mL injection 40 mg, 40 mg, Intravenous, Q24H, Desai, Rahul P, PA-C, 40 mg at 11/09/21 0719   methylPREDNISolone sodium succinate (SOLU-MEDROL) 40  mg/mL injection 40 mg, 40 mg, Intravenous, Once, Desai, Rahul P, PA-C   pantoprazole (PROTONIX) injection 40 mg, 40 mg, Intravenous, Q24H, Luciano Cutter, MD, 40 mg at 11/08/21 0901   polyethylene glycol (MIRALAX / GLYCOLAX) packet 17 g, 17 g, Per Tube, Daily PRN, Luciano Cutter, MD   Racepinephrine HCl 2.25 % nebulizer solution 0.5 mL, 0.5 mL, Nebulization, Q4H PRN, Celine Mans, Rahul P, PA-C  Pertinent Labs Labs consistent with streptococcal bacteremia. UDS positive for  Benzodiazepines.   Imaging MD has reviewed images in epic and the results pertinent to this consultation are:  MRI Brain 11/08/21.  1. No acute intracranial abnormality. 2. Minimal amount of nonspecific T2 hyperintense lesions of the white matter, may represent early chronic microangiopathy, migraines related, demyelination or sequela of prior inflammatory/infectious process.  CTA head and neck 11/08/21.  Negative CTA of the head and neck. No large vessel occlusion, hemodynamically significant stenosis, or other acute vascular abnormality.  EEG This study is within normal limits. The excessive beta activity seen in the background is most likely due to the effect of medications like benzodiazepine and is a benign EEG pattern. No seizures or epileptiform discharges were seen throughout the recording.  Assessment: 46 yo incarcerated male who presented with unresponsiveness after he was found down in his cell. He was positive for Benzos on UDS, but denies drug use. No benzodiazepine on his outpatient medications list in Epic.  - DDx includes effects of sedation from possible benzodiazepine use in the facility. - He has made progress as exam today reveals an alert, oriented male.  - PCCM states he will move out of ICU today.   Recommendations/Plan:  -Continue to minimize sedation.  -Off ventilator, encouraging.  -Neurology with no further suggestions for neurological workup. Will be available for questions prn.   Pt seen by Jimmye Norman, MSN, APN-BC/Nurse Practitioner/Neuro and later by MD.   Pager: [phone]  Electronically signed: Dr. Caryl Pina

## 2021-11-09 NOTE — Evaluation (Addendum)
Occupational Therapy Evaluation/Discharge Patient Details Name: Jeremiah Yates MRN: [id number] DOB: 01-15-1976 Today's Date: 11/09/2021   History of Present Illness Pt [facility]  [facility]. [facility]ub[facility] in [facility] ED on 1/10. Extubated 1/11 but with continued wheezing. Acute encephalopathy of unknown etiology. PMH - asthma.   Clinical Impression   Pt presents with minor deficits in cardiopulmonary tolerance and dynamic standing balance. Pt able to complete ADLs and hallway mobility without AD with assist for line mgmt only. Pt denies SOB but does endorse feeling mildly unsteady and tremulous with tasks at times though this does not hinder functional abilities. Educated re: activity progression, energy conservation strategies, and encouraged mobility with staff while admitted. Anticipate pt to return quickly to PLOF without need for skilled OT services follow-up. OT to sign off at acute level. Please reconsult if needs change.  HR sustained 130s with ADLs/mobility, highest HR [id number]  SpO2 >92% on 2 L O2     Recommendations for follow up therapy are one component of a multi-disciplinary discharge planning process, led by the attending physician.  Recommendations may be updated based on patient status, additional functional criteria and insurance authorization.   Follow Up Recommendations  No OT follow up    Assistance Recommended at Discharge None  Patient can return home with the following      Functional Status Assessment  Patient has had a recent decline in their functional status and demonstrates the ability to make significant improvements in function in a reasonable and predictable amount of time.  Equipment Recommendations  None recommended by OT    Recommendations for Other Services       Precautions / Restrictions Precautions Precautions: Other (comment) Precaution Comments: Hydrographic surveyor present; monitor HR Restrictions Weight Bearing Restrictions: No       Mobility Bed Mobility Overal bed mobility: Modified Independent                  Transfers Overall transfer level: Independent Equipment used: None Transfers: Sit to/from Stand Sit to Stand: Independent           General transfer comment: Assist for safety      Balance Overall balance assessment: Mild deficits observed, not formally tested                                         ADL either performed or assessed with clinical judgement   ADL Overall ADL's : Needs assistance/impaired     Grooming: Independent;Standing;Oral care               Lower Body Dressing: Independent;Sit to/from stand               Functional mobility during ADLs: Supervision/safety General ADL Comments: assist only for line mgmt, able to complete ADLs without physical assist. Emphasized progression of endurance with mobility, breathing techniques, energy conservation and when to take breaks.     Vision Ability to See in Adequate Light: 0 Adequate Patient Visual Report: No change from baseline Vision Assessment?: No apparent visual deficits     Perception     Praxis      Pertinent Vitals/Pain Pain Assessment: No/denies pain     Hand Dominance Right   Extremity/Trunk Assessment Upper Extremity Assessment Upper Extremity Assessment: Overall WFL for tasks assessed   Lower Extremity Assessment Lower Extremity Assessment: Defer to PT evaluation   Cervical / Trunk Assessment  Cervical / Trunk Assessment: Normal   Communication Communication Communication: No difficulties   Cognition Arousal/Alertness: Awake/alert Behavior During Therapy: Flat affect Overall Cognitive Status: Within Functional Limits for tasks assessed                                       General Comments  HR sustained 130s with ADLs and hallway mobility. 140bpm at the highest. SpO2 >92% on 2 L O2 throughout    Exercises     Shoulder Instructions       Home Living Family/patient expects to be discharged to:: [facility]                                        Prior Functioning/Environment Prior Level of Function : Independent/Modified Independent             Mobility Comments: No assistive device ADLs Comments: able to complete ADLs without issue, sometimes SOB but able to complete tasks without assist; expresses interest in motorcycles        OT Problem List:        OT Treatment/Interventions:      OT Goals(Current goals can be found in the care plan section) Acute Rehab OT Goals Patient Stated Goal: feel better soon OT Goal Formulation: All assessment and education complete, DC therapy  OT Frequency:      Co-evaluation              AM-PAC OT "6 Clicks" Daily Activity     Outcome Measure Help from another person eating meals?: None Help from another person taking care of personal grooming?: None Help from another person toileting, which includes using toliet, bedpan, or urinal?: None Help from another person bathing (including washing, rinsing, drying)?: None Help from another person to put on and taking off regular upper body clothing?: None Help from another person to put on and taking off regular lower body clothing?: None 6 Click Score: 24   End of Session Equipment Utilized During Treatment: Oxygen Nurse Communication: Mobility status  Activity Tolerance: Patient tolerated treatment well Patient left: in bed;with call bell/phone within reach;Other (comment) (officer in room)  OT Visit Diagnosis: Other abnormalities of gait and mobility (R26.89);Muscle weakness (generalized) (M62.81)                Time: 6578-4696 OT Time Calculation (min): 20 min Charges:  OT General Charges $OT Visit: 1 Visit OT Evaluation $OT Eval Low Complexity: 1 Low  Jeremiah Yates, OTR/L Acute Rehab Services Office: [phone]   Lorre Munroe 11/09/2021, 12:56 PM

## 2021-11-09 NOTE — Progress Notes (Signed)
[place] Progress Note Patient Name: Jeremiah Yates DOB: 20-Dec-1975 MRN: [id number]   Date of Service  11/09/2021  HPI/Events of Note  Patient with sub-optimal blood pressure control.  eICU Interventions  Hydralazine order modified to optimize blood pressure control.        Frederik Pear 11/09/2021, 9:39 PM

## 2021-11-10 ENCOUNTER — Inpatient Hospital Stay (HOSPITAL_COMMUNITY)

## 2021-11-10 DIAGNOSIS — R7881 Bacteremia: Secondary | ICD-10-CM | POA: Diagnosis not present

## 2021-11-10 LAB — CULTURE, BLOOD (ROUTINE X 2): Special Requests: ADEQUATE

## 2021-11-10 LAB — BASIC METABOLIC PANEL
Anion gap: 8 (ref 5–15)
BUN: 22 mg/dL — ABNORMAL HIGH (ref 6–20)
CO2: 26 mmol/L (ref 22–32)
Calcium: 8.8 mg/dL — ABNORMAL LOW (ref 8.9–10.3)
Chloride: 109 mmol/L (ref 98–111)
Creatinine, Ser: 0.95 mg/dL (ref 0.61–1.24)
GFR, Estimated: >60 mL/min (ref >60)
Glucose, Bld: 110 mg/dL — ABNORMAL HIGH (ref 70–99)
Potassium: 3.9 mmol/L (ref 3.5–5.1)
Sodium: 143 mmol/L (ref 135–145)

## 2021-11-10 LAB — GLUCOSE, CAPILLARY
Glucose-Capillary: 107 mg/dL — ABNORMAL HIGH (ref 70–99)
Glucose-Capillary: 107 mg/dL — ABNORMAL HIGH (ref 70–99)
Glucose-Capillary: 122 mg/dL — ABNORMAL HIGH (ref 70–99)
Glucose-Capillary: 135 mg/dL — ABNORMAL HIGH (ref 70–99)
Glucose-Capillary: 232 mg/dL — ABNORMAL HIGH (ref 70–99)
Glucose-Capillary: 97 mg/dL (ref 70–99)

## 2021-11-10 LAB — CBC
HCT: 43 % (ref 39.0–52.0)
Hemoglobin: 14.3 g/dL (ref 13.0–17.0)
MCH: 31.1 pg (ref 26.0–34.0)
MCHC: 33.3 g/dL (ref 30.0–36.0)
MCV: 93.5 fL (ref 80.0–100.0)
Platelets: 291 10*3/uL (ref 150–400)
RBC: 4.6 MIL/uL (ref 4.22–5.81)
RDW: 16.1 % — ABNORMAL HIGH (ref 11.5–15.5)
WBC: 19.8 10*3/uL — ABNORMAL HIGH (ref 4.0–10.5)
nRBC: 0 % (ref 0.0–0.2)

## 2021-11-10 LAB — ECHOCARDIOGRAM COMPLETE
AR max vel: 4.52 cm2
AV Area VTI: 4.32 cm2
AV Area mean vel: 4.72 cm2
AV Mean grad: 1 mmHg
AV Peak grad: 2.8 mmHg
Ao pk vel: 0.83 m/s
Area-P 1/2: 2.69 cm2
Height: 68 in
S' Lateral: 3.1 cm
Weight: 2917.13 oz

## 2021-11-10 LAB — PHOSPHORUS: Phosphorus: 4.6 mg/dL (ref 2.5–4.6)

## 2021-11-10 LAB — MAGNESIUM: Magnesium: 2.3 mg/dL (ref 1.7–2.4)

## 2021-11-10 MED ORDER — PREDNISONE 20 MG PO TABS: 20.0000 mg | ORAL_TABLET | Freq: Every day | ORAL | Status: DC

## 2021-11-10 MED ORDER — DOCUSATE SODIUM 100 MG PO CAPS: 100.0000 mg | ORAL_CAPSULE | Freq: Two times a day (BID) | ORAL | Status: DC | PRN

## 2021-11-10 MED ORDER — PANTOPRAZOLE SODIUM 40 MG PO TBEC
40.0000 mg | DELAYED_RELEASE_TABLET | Freq: Every day | ORAL | Status: DC
  Administered 2021-11-10 – 2021-11-14 (×5): 40 mg via ORAL
  Filled 2021-11-10 (×5): qty 1

## 2021-11-10 MED ORDER — PREDNISONE 20 MG PO TABS
40.0000 mg | ORAL_TABLET | Freq: Every day | ORAL | Status: DC
  Administered 2021-11-11 – 2021-11-14 (×4): 40 mg via ORAL
  Filled 2021-11-10 (×4): qty 2

## 2021-11-10 MED ORDER — POLYETHYLENE GLYCOL 3350 17 G PO PACK: 17.0000 g | PACK | Freq: Every day | ORAL | Status: DC | PRN

## 2021-11-10 NOTE — Progress Notes (Signed)
°  Transition of Care [facility]) Screening Note   Patient Details  Name: Jeremiah Yates Date of Birth: 1976-10-10   Transition of Care [facility]) CM/SW Contact:    Mearl Latin, LCSW Phone Number: 11/10/2021, 3:39 PM    Transition of Care Department [facility]) has reviewed patient and no TOC needs have been identified at this time. We will continue to monitor patient advancement through interdisciplinary progression rounds. If new patient transition needs arise, please place a TOC consult.

## 2021-11-10 NOTE — Progress Notes (Signed)
[facility] PROGRESS NOTE  Jeremiah Yates  DOB: Mar 17, 1976  PCP: Patient, No Pcp Per (Inactive) [id number]  DOA: 11/07/2021  LOS: 3 days  Hospital Day: 4  Chief complaint: Unresponsiveness   Brief narrative: Jeremiah Yates is a 46 y.o. male with PMH significant for HTN, asthma, nephrolithiasis [facility] ED at [facility] on 11/07/2021 for unresponsiveness.  He did not respond to Narcan.   Intubated at [facility] ED.  CT head unremarkable. [facility] for neuro eval and imaging. Extubated on 1/11, transferred to [facility] on 1/13  Subjective: Patient was seen and examined this morning.  Middle-aged Caucasian male.  Lying in bed.  Opens eyes on verbal command.  Oriented x3.  On 3 to 4 L oxygen by nasal cannula.    Assessment/Plan: Acute encephalopathy -Brought in for unresponsiveness, unclear etiology.  -Did not respond to Narcan. UDS negative.  CT head, CT angio head and neck, EEG, MRI all negative.  Ammonia and TSH level normal. -Mental status gradually improving.  Avoid sedating meds. -Adamantly denies doing any drugs.  Acute hypoxemic respiratory failure History of asthma -Intubated in the ED for unresponsiveness.  Extubated the next day on 1/11. -Post extubation, patient was noted to have wheezing, required Solu-Medrol, racemic epinephrine. -Continue bronchodilators.  Strep bacteremia -1 out of 2 blood cultures obtained on admission grew Streptococcus. -Initially felt to be contaminant but with leukocytosis, patient was started on Augmentin for 5 days. -WBC count improving today.  Repeat lactic acid level tomorrow. -I will repeat blood culture today.  Also obtain an echocardiogram because of gram-positive bacteremia Recent Labs  Lab 11/07/21 1940 11/07/21 2014 11/08/21 0257 11/09/21 0435 11/10/21 0224  WBC 14.6*  --  15.7* 20.2* 19.8*  LATICACIDVEN  --  2.0*  --   --   --    Mobility: Able to ambulate without assistance Living condition:  Prison Goals of care:   Code Status: Full Code  Nutritional status: Body mass index is 27.72 kg/m.      Diet:  Diet Order             Diet regular Room service appropriate? Yes; Fluid consistency: Thin  Diet effective now                  DVT prophylaxis:  heparin injection 5,000 Units Start: 11/08/21 0600 SCDs Start: 11/08/21 0221   Antimicrobials: Augmentin Fluid: None Consultants: Pulmonology Family Communication:.  Police officer at bedside  Status is: Inpatient  Continue in-hospital care because: Improving respiratory failure Level of care: Progressive   Dispo: The patient is from: [place]              Anticipated d/c is to: [place]              Patient currently is not medically stable to d/c.   Difficult to place patient No     Infusions:   sodium chloride Stopped (11/08/21 0925)    Scheduled Meds:  amoxicillin-clavulanate  1 tablet Oral Q12H   arformoterol  15 mcg Nebulization BID   budesonide (PULMICORT) nebulizer solution  0.5 mg Nebulization BID   Chlorhexidine Gluconate Cloth  6 each Topical Daily   heparin  5,000 Units Subcutaneous Q8H   insulin aspart  0-15 Units Subcutaneous Q4H   pantoprazole  40 mg Oral Daily   [START ON 11/11/2021] predniSONE  40 mg Oral Q breakfast   Followed by   Derrill Memo ON 11/16/2021] predniSONE  20 mg  Oral Q breakfast    PRN meds: sodium chloride, albuterol, docusate sodium, hydrALAZINE, polyethylene glycol, Racepinephrine HCl   Antimicrobials: Anti-infectives (From admission, onward)    Start     Dose/Rate Route Frequency Ordered Stop   11/09/21 1000  amoxicillin-clavulanate (AUGMENTIN) 875-125 MG per tablet 1 tablet        1 tablet Oral Every 12 hours 11/09/21 0749 11/14/21 0959       Objective: Vitals:   11/10/21 0733 11/10/21 0800  BP:  121/78  Pulse: 93 (!) 104  Resp: 14 17  Temp: 98 F (36.7 C)   SpO2: 93% (!) 89%    Intake/Output Summary (Last 24 hours) at 11/10/2021 1249 Last data filed at  11/10/2021 0755 Gross per 24 hour  Intake 1311 ml  Output 775 ml  Net 536 ml   Filed Weights   11/08/21 0132 11/09/21 0421 11/10/21 0346  Weight: 95.7 kg 82.7 kg 82.7 kg   Weight change: 0 kg Body mass index is 27.72 kg/m.   Physical Exam: General exam: Young Caucasian male not in physical distress Skin: No rashes, lesions or ulcers. HEENT: Atraumatic, normocephalic, no obvious bleeding Lungs: Continues to have diffuse bilateral wheezing with cough on deep breathing CVS: Regular rate and rhythm, no murmur GI/Abd soft, nontender, nondistended, bowel sound present CNS: Alert, awake, oriented x3 Psychiatry: Mood appropriate Extremities: No pedal edema, no calf tenderness  Data Review: I have personally reviewed the laboratory data and studies available.  F/u labs ordered Unresulted Labs (From admission, onward)     Start     Ordered   11/10/21 0902  Culture, blood (routine x 2)  BLOOD CULTURE X 2,   R      11/10/21 0902   11/09/21 0046  Blood gas, arterial  ONCE - STAT,   STAT        11/09/21 0045   Unscheduled  CBC with Differential/Platelet  Tomorrow morning,   R       Question:  Specimen collection method  Answer:  Lab=Lab collect   11/10/21 1249   Unscheduled  Basic metabolic panel  Tomorrow morning,   R       Question:  Specimen collection method  Answer:  Lab=Lab collect   11/10/21 1249            Signed, Terrilee Croak, MD [facility] 11/10/2021

## 2021-11-10 NOTE — Progress Notes (Signed)
°  Echocardiogram 2D Echocardiogram has been performed.  Jeremiah Yates 11/10/2021, 11:36 AM

## 2021-11-11 LAB — BASIC METABOLIC PANEL
Anion gap: 8 (ref 5–15)
BUN: 21 mg/dL — ABNORMAL HIGH (ref 6–20)
CO2: 27 mmol/L (ref 22–32)
Calcium: 8.7 mg/dL — ABNORMAL LOW (ref 8.9–10.3)
Chloride: 104 mmol/L (ref 98–111)
Creatinine, Ser: 0.92 mg/dL (ref 0.61–1.24)
GFR, Estimated: >60 mL/min (ref >60)
Glucose, Bld: 92 mg/dL (ref 70–99)
Potassium: 4.2 mmol/L (ref 3.5–5.1)
Sodium: 139 mmol/L (ref 135–145)

## 2021-11-11 LAB — CBC WITH DIFFERENTIAL/PLATELET
Abs Immature Granulocytes: 0.09 10*3/uL — ABNORMAL HIGH (ref 0.00–0.07)
Basophils Absolute: 0 10*3/uL (ref 0.0–0.1)
Basophils Relative: 0 %
Eosinophils Absolute: 0 10*3/uL (ref 0.0–0.5)
Eosinophils Relative: 0 %
HCT: 43.6 % (ref 39.0–52.0)
Hemoglobin: 13.9 g/dL (ref 13.0–17.0)
Immature Granulocytes: 1 %
Lymphocytes Relative: 20 %
Lymphs Abs: 3.1 10*3/uL (ref 0.7–4.0)
MCH: 30.5 pg (ref 26.0–34.0)
MCHC: 31.9 g/dL (ref 30.0–36.0)
MCV: 95.6 fL (ref 80.0–100.0)
Monocytes Absolute: 1.5 10*3/uL — ABNORMAL HIGH (ref 0.1–1.0)
Monocytes Relative: 10 %
Neutro Abs: 10.5 10*3/uL — ABNORMAL HIGH (ref 1.7–7.7)
Neutrophils Relative %: 69 %
Platelets: 269 10*3/uL (ref 150–400)
RBC: 4.56 MIL/uL (ref 4.22–5.81)
RDW: 16.1 % — ABNORMAL HIGH (ref 11.5–15.5)
WBC: 15.3 10*3/uL — ABNORMAL HIGH (ref 4.0–10.5)
nRBC: 0 % (ref 0.0–0.2)

## 2021-11-11 LAB — GLUCOSE, CAPILLARY
Glucose-Capillary: 109 mg/dL — ABNORMAL HIGH (ref 70–99)
Glucose-Capillary: 145 mg/dL — ABNORMAL HIGH (ref 70–99)
Glucose-Capillary: 165 mg/dL — ABNORMAL HIGH (ref 70–99)
Glucose-Capillary: 121 mg/dL — ABNORMAL HIGH (ref 70–99)
Glucose-Capillary: 181 mg/dL — ABNORMAL HIGH (ref 70–99)
Glucose-Capillary: 114 mg/dL — ABNORMAL HIGH (ref 70–99)

## 2021-11-11 NOTE — Progress Notes (Signed)
[facility] PROGRESS NOTE  Jeremiah Yates  DOB: 1976-09-01  PCP: Patient, No Pcp Per (Inactive) [id number]  DOA: 11/07/2021  LOS: 4 days  Hospital Day: 5  Chief complaint: Unresponsiveness   Brief narrative: Jeremiah Yates is a 46 y.o. male with PMH significant for HTN, asthma, nephrolithiasis [facility] on 11/07/2021 for unresponsiveness.  He did not respond to Narcan.   Intubated at [facility] ED.  CT head unremarkable. Transferred to [facility] ICU for neuro eval and imaging. Extubated on 1/11, transferred to [facility] on 1/13  Subjective: Patient was seen and examined this morning.   Lying on bed.  Not in distress on low-flow oxygen.   Echocardiogram yesterday showed EF of 55 to 60%, and echo bright density in the aortic valve measuring 0.6 to 0.8 cm.  Cannot rule out vegetation.  Assessment/Plan: Acute encephalopathy -Brought in for unresponsiveness, unclear etiology.  -Did not respond to Narcan. UDS negative.  CT head, CT angio head and neck, EEG, MRI all negative.  Ammonia and TSH level normal. -Mental status gradually improving.  Avoid sedating meds. -Adamantly denies doing any drugs.  Acute hypoxemic respiratory failure History of asthma -Intubated in the ED for unresponsiveness.  Extubated the next day on 1/11. -Post extubation, patient was noted to have wheezing, required Solu-Medrol, racemic epinephrine. -Continue bronchodilators.  Strep bacteremia Possible aortic valve vegetation -1 out of 2 blood cultures obtained on admission grew Streptococcus. -Initially felt to be contaminant but with leukocytosis, patient was started on Augmentin for 5 days. -WBC count improving today.  Repeat lactic acid level tomorrow. -Repeat blood cultures sent on 1/13. -Echocardiogram 1/13 showed EF of 55 to 60%, and echo bright density in the aortic valve measuring 0.6 to 0.8 cm.  Cannot rule out vegetation.  TEE planned for Tuesday 1/17. Recent Labs  Lab  11/07/21 1940 11/07/21 2014 11/08/21 0257 11/09/21 0435 11/10/21 0224 11/11/21 0058  WBC 14.6*  --  15.7* 20.2* 19.8* 15.3*  LATICACIDVEN  --  2.0*  --   --   --   --     Mobility: Able to ambulate without assistance Living condition: Prison Goals of care:   Code Status: Full Code  Nutritional status: Body mass index is 27.72 kg/m.      Diet:  Diet Order             Diet regular Room service appropriate? Yes; Fluid consistency: Thin  Diet effective now                  DVT prophylaxis:  heparin injection 5,000 Units Start: 11/08/21 0600 SCDs Start: 11/08/21 0221   Antimicrobials: Augmentin Fluid: None Consultants: Pulmonology Family Communication:.  Police officer at bedside  Status is: Inpatient  Continue in-hospital care because: Improving respiratory failure Level of care: Progressive   Dispo: The patient is from: Prison              Anticipated d/c is to: Prison              Patient currently is not medically stable to d/c.   Difficult to place patient No     Infusions:   sodium chloride Stopped (11/08/21 0925)    Scheduled Meds:  amoxicillin-clavulanate  1 tablet Oral Q12H   arformoterol  15 mcg Nebulization BID   budesonide (PULMICORT) nebulizer solution  0.5 mg Nebulization BID   Chlorhexidine Gluconate Cloth  6 each Topical Daily   heparin  5,000  Units Subcutaneous Q8H   insulin aspart  0-15 Units Subcutaneous Q4H   pantoprazole  40 mg Oral Daily   predniSONE  40 mg Oral Q breakfast   Followed by   Melene Muller ON 11/16/2021] predniSONE  20 mg Oral Q breakfast    PRN meds: sodium chloride, albuterol, docusate sodium, hydrALAZINE, polyethylene glycol, Racepinephrine HCl   Antimicrobials: Anti-infectives (From admission, onward)    Start     Dose/Rate Route Frequency Ordered Stop   11/09/21 1000  amoxicillin-clavulanate (AUGMENTIN) 875-125 MG per tablet 1 tablet        1 tablet Oral Every 12 hours 11/09/21 0749 11/14/21 0959        Objective: Vitals:   11/11/21 0840 11/11/21 1155  BP:  (!) 134/97  Pulse:  90  Resp:  16  Temp:  97.6 F (36.4 C)  SpO2: 96% 92%    Intake/Output Summary (Last 24 hours) at 11/11/2021 1358 Last data filed at 11/10/2021 1946 Gross per 24 hour  Intake --  Output 1200 ml  Net -1200 ml    Filed Weights   11/09/21 0421 11/10/21 0346 11/11/21 0429  Weight: 82.7 kg 82.7 kg 82.7 kg   Weight change: 0 kg Body mass index is 27.72 kg/m.   Physical Exam: General exam: Young Caucasian male not in physical distress Skin: No rashes, lesions or ulcers. HEENT: Atraumatic, normocephalic, no obvious bleeding Lungs: Continues to have diffuse bilateral wheezing with cough on deep breathing CVS: Regular rate and rhythm, no murmur GI/Abd soft, nontender, nondistended, bowel sound present CNS: Alert, awake, oriented x3 Psychiatry: Mood appropriate Extremities: No pedal edema, no calf tenderness  Data Review: I have personally reviewed the laboratory data and studies available.  F/u labs ordered Unresulted Labs (From admission, onward)     Start     Ordered   11/10/21 0902  Culture, blood (routine x 2)  BLOOD CULTURE X 2,   R (with TIMED occurrences)      11/10/21 0902   11/09/21 0046  Blood gas, arterial  ONCE - STAT,   STAT        11/09/21 0045            Signed, Lorin Glass, MD [facility] 11/11/2021

## 2021-11-12 LAB — GLUCOSE, CAPILLARY
Glucose-Capillary: 107 mg/dL — ABNORMAL HIGH (ref 70–99)
Glucose-Capillary: 107 mg/dL — ABNORMAL HIGH (ref 70–99)
Glucose-Capillary: 145 mg/dL — ABNORMAL HIGH (ref 70–99)
Glucose-Capillary: 89 mg/dL (ref 70–99)
Glucose-Capillary: 93 mg/dL (ref 70–99)
Glucose-Capillary: 93 mg/dL (ref 70–99)

## 2021-11-12 LAB — CULTURE, BLOOD (ROUTINE X 2)
Culture: NO GROWTH
Special Requests: ADEQUATE

## 2021-11-12 NOTE — Progress Notes (Signed)
[facility] PROGRESS NOTE  Jeremiah Yates  DOB: 1976/10/04  PCP: Patient, No Pcp Per (Inactive) [id number]  DOA: 11/07/2021  LOS: 5 days  Hospital Day: 6  Chief complaint: Unresponsiveness   Brief narrative: Jeremiah Yates is a 46 y.o. male with PMH significant for HTN, asthma, nephrolithiasis [facility] on 11/07/2021 for unresponsiveness.  He did not respond to Narcan.   Intubated at [facility] ED.  CT head unremarkable. Transferred to [facility] ICU for neuro eval and imaging. Extubated on 1/11, transferred to [facility] on 1/13  Subjective: Patient was seen and examined this morning.   Lying on bed.  Remains on low-flow oxygen. Pending TEE on 1/17.  Assessment/Plan: Acute encephalopathy -Brought in for unresponsiveness, unclear etiology.  -Did not respond to Narcan. UDS negative.  CT head, CT angio head and neck, EEG, MRI all negative.  Ammonia and TSH level normal. -Mental status gradually improving.  Avoid sedating meds.  Acute hypoxemic respiratory failure History of asthma -Intubated in the ED for unresponsiveness.  Extubated the next day on 1/11. -Post extubation, patient was noted to have wheezing, required Solu-Medrol, racemic epinephrine. -Continue bronchodilators, prednisone 40 mg daily.  Strep bacteremia Possible aortic valve vegetation -1 out of 2 blood cultures obtained on admission grew Streptococcus. -Initially felt to be contaminant but with leukocytosis, patient was started on Augmentin for 5 days. -WBC count improving. -Repeat blood cultures sent on 1/13. -Echocardiogram 1/13 showed EF of 55 to 60%, and echo bright density in the aortic valve measuring 0.6 to 0.8 cm.  Cannot rule out vegetation.  TEE planned for Tuesday 1/17.  2. Recent Labs  Lab 11/07/21 1940 11/07/21 2014 11/08/21 0257 11/09/21 0435 11/10/21 0224 11/11/21 0058  WBC 14.6*  --  15.7* 20.2* 19.8* 15.3*  LATICACIDVEN  --  2.0*  --   --   --   --     Mobility:  Able to ambulate without assistance Living condition: Prison Goals of care:   Code Status: Full Code  Nutritional status: Body mass index is 27.49 kg/m.      Diet:  Diet Order             Diet regular Room service appropriate? Yes; Fluid consistency: Thin  Diet effective now                  DVT prophylaxis:  heparin injection 5,000 Units Start: 11/08/21 0600 SCDs Start: 11/08/21 0221   Antimicrobials: Augmentin Fluid: None Consultants: Pulmonology Family Communication:.  Police officer at bedside  Status is: Inpatient  Continue in-hospital care because: TEE pending on 1/17 Level of care: Progressive   Dispo: The patient is from: Prison              Anticipated d/c is to: Prison              Patient currently is not medically stable to d/c.   Difficult to place patient No     Infusions:   sodium chloride Stopped (11/08/21 0925)    Scheduled Meds:  amoxicillin-clavulanate  1 tablet Oral Q12H   arformoterol  15 mcg Nebulization BID   budesonide (PULMICORT) nebulizer solution  0.5 mg Nebulization BID   Chlorhexidine Gluconate Cloth  6 each Topical Daily   heparin  5,000 Units Subcutaneous Q8H   insulin aspart  0-15 Units Subcutaneous Q4H   pantoprazole  40 mg Oral Daily   predniSONE  40 mg Oral Q breakfast  Followed by   Melene Muller ON 11/16/2021] predniSONE  20 mg Oral Q breakfast    PRN meds: sodium chloride, albuterol, docusate sodium, hydrALAZINE, polyethylene glycol, Racepinephrine HCl   Antimicrobials: Anti-infectives (From admission, onward)    Start     Dose/Rate Route Frequency Ordered Stop   11/09/21 1000  amoxicillin-clavulanate (AUGMENTIN) 875-125 MG per tablet 1 tablet        1 tablet Oral Every 12 hours 11/09/21 0749 11/14/21 0959       Objective: Vitals:   11/12/21 1145 11/12/21 1200  BP: (!) 142/107 (!) 135/99  Pulse: 83   Resp: 17   Temp: 98 F (36.7 C)   SpO2: 93%     Intake/Output Summary (Last 24 hours) at 11/12/2021  1559 Last data filed at 11/11/2021 1923 Gross per 24 hour  Intake --  Output 1300 ml  Net -1300 ml    Filed Weights   11/10/21 0346 11/11/21 0429 11/12/21 0343  Weight: 82.7 kg 82.7 kg 82 kg   Weight change: -0.7 kg Body mass index is 27.49 kg/m.   Physical Exam: General exam: Young Caucasian male not in physical distress Skin: No rashes, lesions or ulcers. HEENT: Atraumatic, normocephalic, no obvious bleeding Lungs: Continues to have diffuse bilateral wheezing with cough on deep breathing CVS: Regular rate and rhythm, no murmur GI/Abd soft, nontender, nondistended, bowel sound present CNS: Alert, awake, oriented x3 Psychiatry: Mood appropriate Extremities: No pedal edema, no calf tenderness  Data Review: I have personally reviewed the laboratory data and studies available.  F/u labs ordered Unresulted Labs (From admission, onward)    None       Signed, Lorin Glass, MD [facility] 11/12/2021

## 2021-11-12 NOTE — Progress Notes (Signed)
HOSPITAL MEDICINE OVERNIGHT EVENT NOTE    Notified by nursing that patient is exhibiting a yellow MEWS score.  Patient is exhibiting tachycardia with heart rates ranging between the 100s to 110's.  According to nursing, patient is exhibiting no new symptoms.  Chart reviewed, patient is currently receiving antibiotic therapy for Streptococcus bacteremia.  Continue current plan of care, continue to monitor closely.  Marinda Elk  MD [facility]

## 2021-11-13 LAB — GLUCOSE, CAPILLARY
Glucose-Capillary: 103 mg/dL — ABNORMAL HIGH (ref 70–99)
Glucose-Capillary: 87 mg/dL (ref 70–99)
Glucose-Capillary: 134 mg/dL — ABNORMAL HIGH (ref 70–99)
Glucose-Capillary: 151 mg/dL — ABNORMAL HIGH (ref 70–99)
Glucose-Capillary: 116 mg/dL — ABNORMAL HIGH (ref 70–99)
Glucose-Capillary: 107 mg/dL — ABNORMAL HIGH (ref 70–99)

## 2021-11-13 NOTE — Progress Notes (Signed)
° ° °  [facility] has been requested to perform a transesophageal echocardiogram on Mr. Jeremiah Yates for possible aortic valve vegetation.  After careful review of history and examination, the risks and benefits of transesophageal echocardiogram have been explained including risks of esophageal damage, perforation (1:10,000 risk), bleeding, pharyngeal hematoma as well as other potential complications associated with conscious sedation including aspiration, arrhythmia, respiratory failure and death. Alternatives to treatment were discussed, questions were answered. Patient is willing to proceed.  TEE - Dr. Anne Fu @ 830am. NPO after midnight.   Manson Passey, PA-C 11/13/2021 3:39 PM

## 2021-11-13 NOTE — Progress Notes (Signed)
. °PROGRESS NOTE ° °Jeremiah Yates  °DOB: 11/24/1975  °PCP: Patient, No Pcp Per (Inactive) °MRN:[id number]  °DOA: 11/07/2021 ° LOS: 6 days  °Hospital Day: 7 ° °Chief complaint: Unresponsiveness °  °Brief narrative: °Jeremiah Yates is a 45 y.o. male with PMH significant for HTN, asthma, nephrolithiasis °[facility] on 11/07/2021 for unresponsiveness.  He did not respond to Narcan.   °Intubated at [facility].  °CT head unremarkable. °Transferred to [facility] for neuro eval and imaging. °Extubated on 1/11, transferred to [facility] on 1/13 ° °Subjective: °Patient was seen and examined this morning.   °Lying on bed.  Remains on low-flow oxygen. °Officer at bedside. °Pending TEE on 1/17. ° °Assessment/Plan: °Acute encephalopathy °-Brought in for unresponsiveness, unclear etiology.  °-Did not respond to Narcan. UDS negative.  CT head, CT angio head and neck, EEG, MRI all negative.  Ammonia and TSH level normal. °-Mental status gradually improving.  Avoid sedating meds. ° °Acute hypoxemic respiratory failure °History of asthma °-Intubated in the ED for unresponsiveness.  Extubated the next day on 1/11. °-Post extubation, patient was noted to have wheezing, required Solu-Medrol, racemic epinephrine. °-Continue bronchodilators, prednisone 40 mg daily. ° °Strep bacteremia °Possible aortic valve vegetation °-1 out of 2 blood cultures obtained on admission grew Streptococcus. °-Initially felt to be contaminant but with leukocytosis, patient was started on Augmentin for 5 days. °-WBC count improving. °-Repeat blood cultures sent on 1/13 did not see any growth. °-Echocardiogram 1/13 showed EF of 55 to 60%, and echo bright density in the aortic valve measuring 0.6 to 0.8 cm.  Cannot rule out vegetation.  TEE planned for Tuesday 1/17.   °Recent Labs  °Lab 11/07/21 °1940 11/07/21 °2014 11/08/21 °0257 11/09/21 °0435 11/10/21 °0224 11/11/21 °0058  °WBC 14.6*  --  15.7* 20.2* 19.8* 15.3*  °LATICACIDVEN  --   2.0*  --   --   --   --   ° °Mobility: Able to ambulate without assistance.  Encourage ambulation. °Living condition: Prison °Goals of care:   Code Status: Full Code  °Nutritional status: °Body mass index is 32.05 kg/m².  °  °  °Diet:  °Diet Order   ° °       °  Diet NPO time specified  Diet effective midnight       °  °  Diet regular Room service appropriate? Yes; Fluid consistency: Thin  Diet effective now       °  ° °  °  ° °  ° °DVT prophylaxis:  °heparin injection 5,000 Units Start: 11/08/21 0600 °SCDs Start: 11/08/21 0221 °  °Antimicrobials: Augmentin °Fluid: None °Consultants: Pulmonology °Family Communication:.  Police officer at bedside ° °Status is: Inpatient ° °Continue in-hospital care because: TEE pending on 1/17 °Level of care: Progressive  ° °Dispo: The patient is from: Prison °             Anticipated d/c is to: Prison °             Patient currently is not medically stable to d/c. °  Difficult to place patient No ° ° ° ° °Infusions:  ° sodium chloride Stopped (11/08/21 0925)  ° ° °Scheduled Meds: ° amoxicillin-clavulanate  1 tablet Oral Q12H  ° arformoterol  15 mcg Nebulization BID  ° budesonide (PULMICORT) nebulizer solution  0.5 mg Nebulization BID  ° heparin  5,000 Units Subcutaneous Q8H  ° insulin aspart  0-15 Units Subcutaneous Q4H  °   pantoprazole  40 mg Oral Daily  ° predniSONE  40 mg Oral Q breakfast  ° Followed by  ° [START ON 11/16/2021] predniSONE  20 mg Oral Q breakfast  ° ° °PRN meds: °sodium chloride, albuterol, docusate sodium, polyethylene glycol, Racepinephrine HCl  ° °Antimicrobials: °Anti-infectives (From admission, onward)  ° ° Start     Dose/Rate Route Frequency Ordered Stop  ° 11/09/21 1000  amoxicillin-clavulanate (AUGMENTIN) 875-125 MG per tablet 1 tablet       ° 1 tablet Oral Every 12 hours 11/09/21 0749 11/14/21 0959  ° °  ° ° °Objective: °Vitals:  ° 11/13/21 0846 11/13/21 1134  °BP:  122/79  °Pulse:  81  °Resp:  15  °Temp:  97.6 °F (36.4 °C)  °SpO2: 95% 93%   ° ° °Intake/Output Summary (Last 24 hours) at 11/13/2021 1314 °Last data filed at 11/12/2021 1735 °Gross per 24 hour  °Intake --  °Output 600 ml  °Net -600 ml  ° °Filed Weights  ° 11/11/21 0429 11/12/21 0343 11/13/21 0407  °Weight: 82.7 kg 82 kg 95.6 kg  ° °Weight change: 13.6 kg °Body mass index is 32.05 kg/m².  ° °Physical Exam: °General exam: Young Caucasian male not in physical distress °Skin: No rashes, lesions or ulcers. °HEENT: Atraumatic, normocephalic, no obvious bleeding °Lungs: Continues to have diffuse bilateral wheezing with cough on deep breathing °CVS: Regular rate and rhythm, no murmur °GI/Abd soft, nontender, nondistended, bowel sound present °CNS: Alert, awake, oriented x3 °Psychiatry: Mood appropriate °Extremities: No pedal edema, no calf tenderness ° °Data Review: I have personally reviewed the laboratory data and studies available. ° °F/u labs ordered °Unresulted Labs (From admission, onward)  ° ° None  ° °  ° ° °Signed, °Ainsley Sanguinetti, MD °[facility] °11/13/2021 ° ° ° ° ° ° ° ° ° ° °  °

## 2021-11-13 NOTE — Progress Notes (Signed)
°   11/12/21 1940  Assess: MEWS Score  Temp 98 F (36.7 C)  BP (!) 146/95  Pulse Rate (!) 111  ECG Heart Rate (!) 111  Resp 20  Level of Consciousness Alert  SpO2 91 %  O2 Device HFNC  Patient Activity (if Appropriate) In bed  O2 Flow Rate (L/min) 4 L/min

## 2021-11-13 NOTE — H&P (Incomplete)
°  History and Physical    Patient: Jeremiah Yates UXN:[id number] DOB: 1976/05/24 DOA: 11/07/2021 DOS: the patient was seen and examined on 11/13/2021 PCP: Patient, No Pcp Per (Inactive)  Patient coming from: {Point_of_Origin:26777}  Chief Complaint: ***  HPI: Jeremiah Yates is a 46 y.o. male with medical history significant of ***   Review of Systems: {ROS_Text:26778} Past Medical History:  Diagnosis Date   Asthma    Hypertension    Kidney stones    Kidney stones    Past Surgical History:  Procedure Laterality Date   SPLENECTOMY  circa 1998   SPLENECTOMY  1998   Social History:  reports that he has never smoked. He has never used smokeless tobacco. He reports current alcohol use of about 1.0 standard drink per week. He reports that he does not use drugs.  No Known Allergies  Family History  Problem Relation Age of Onset   Breast cancer Mother        alive at age 28   Lung cancer Father        alive at age 65    Prior to Admission medications   Medication Sig Start Date End Date Taking? Authorizing Provider  albuterol (VENTOLIN HFA) 108 (90 Base) MCG/ACT inhaler Inhale 2 puffs into the lungs every 4 (four) hours as needed for wheezing or shortness of breath. 10/11/21  Yes [provider]  Tamsulosin HCl (FLOMAX) 0.4 MG CAPS Take 1 capsule (0.4 mg total) by mouth daily. Patient not taking: Reported on 11/08/2021 06/04/12   Standley Brooking, MD    Physical Exam: Vitals:   11/13/21 1134 11/13/21 1353 11/13/21 1646 11/13/21 1914  BP: 122/79  126/77 (!) 138/95  Pulse: 81  97 95  Resp: 15  18 20   Temp: 97.6 F (36.4 C)  97.6 F (36.4 C) 98 F (36.7 C)  TempSrc: Oral  Oral Oral  SpO2: 93% 93% 92% 91%  Weight:      Height:       ***  Data Reviewed: {Tip this will not be part of the note when signed- Document your independent interpretation of telemetry tracing, EKG, lab, Radiology test or any other diagnostic tests. Add any new diagnostic test ordered  today. (Optional):26781} {Results:26384}  Assessment/Plan No notes have been filed under this hospital service. Service: Hospitalist   Advance Care Planning:   Code Status: Full Code ***  Consults: ***  Family Communication: ***  Severity of Illness: {Observation/Inpatient:21159}  Author: , MD 11/13/2021 7:39 PM  For on call review [email or web address].

## 2021-11-13 NOTE — H&P (View-Only) (Signed)
Jeremiah Yates [facility] PROGRESS NOTE  Jeremiah Yates  DOB: May 12, 1976  PCP: Patient, No Pcp Per (Inactive) PH:[phone]  DOA: 11/07/2021  LOS: 6 days  Hospital Day: 7  Chief complaint: Unresponsiveness   Brief narrative: Jeremiah Yates is a 46 y.o. male with PMH significant for HTN, asthma, nephrolithiasis [facility] on 11/07/2021 for unresponsiveness.  He did not respond to Narcan.   Intubated at [facility] ED.  CT head unremarkable. Transferred to [facility] ICU for neuro eval and imaging. Extubated on 1/11, transferred to [facility] on 1/13  Subjective: Patient was seen and examined this morning.   Lying on bed.  Remains on low-flow oxygen. Officer at bedside. Pending TEE on 1/17.  Assessment/Plan: Acute encephalopathy -Brought in for unresponsiveness, unclear etiology.  -Did not respond to Narcan. UDS negative.  CT head, CT angio head and neck, EEG, MRI all negative.  Ammonia and TSH level normal. -Mental status gradually improving.  Avoid sedating meds.  Acute hypoxemic respiratory failure History of asthma -Intubated in the ED for unresponsiveness.  Extubated the next day on 1/11. -Post extubation, patient was noted to have wheezing, required Solu-Medrol, racemic epinephrine. -Continue bronchodilators, prednisone 40 mg daily.  Strep bacteremia Possible aortic valve vegetation -1 out of 2 blood cultures obtained on admission grew Streptococcus. -Initially felt to be contaminant but with leukocytosis, patient was started on Augmentin for 5 days. -WBC count improving. -Repeat blood cultures sent on 1/13 did not see any growth. -Echocardiogram 1/13 showed EF of 55 to 60%, and echo bright density in the aortic valve measuring 0.6 to 0.8 cm.  Cannot rule out vegetation.  TEE planned for Tuesday 1/17.   Recent Labs  Lab 11/07/21 1940 11/07/21 2014 11/08/21 0257 11/09/21 0435 11/10/21 0224 11/11/21 0058  WBC 14.6*  --  15.7* 20.2* 19.8* 15.3*  LATICACIDVEN  --   2.0*  --   --   --   --    Mobility: Able to ambulate without assistance.  Encourage ambulation. Living condition: Prison Goals of care:   Code Status: Full Code  Nutritional status: Body mass index is 32.05 kg/m.      Diet:  Diet Order             Diet NPO time specified  Diet effective midnight           Diet regular Room service appropriate? Yes; Fluid consistency: Thin  Diet effective now                  DVT prophylaxis:  heparin injection 5,000 Units Start: 11/08/21 0600 SCDs Start: 11/08/21 0221   Antimicrobials: Augmentin Fluid: None Consultants: Pulmonology Family Communication:.  Police officer at bedside  Status is: Inpatient  Continue in-hospital care because: TEE pending on 1/17 Level of care: Progressive   Dispo: The patient is from: [place]              Anticipated d/c is to: [facility]              Patient currently is not medically stable to d/c.   Difficult to place patient No     Infusions:   sodium chloride Stopped (11/08/21 0925)    Scheduled Meds:  amoxicillin-clavulanate  1 tablet Oral Q12H   arformoterol  15 mcg Nebulization BID   budesonide (PULMICORT) nebulizer solution  0.5 mg Nebulization BID   heparin  5,000 Units Subcutaneous Q8H   insulin aspart  0-15 Units Subcutaneous Q4H  pantoprazole  40 mg Oral Daily   predniSONE  40 mg Oral Q breakfast   Followed by   Derrill Memo ON 11/16/2021] predniSONE  20 mg Oral Q breakfast    PRN meds: sodium chloride, albuterol, docusate sodium, polyethylene glycol, Racepinephrine HCl   Antimicrobials: Anti-infectives (From admission, onward)    Start     Dose/Rate Route Frequency Ordered Stop   11/09/21 1000  amoxicillin-clavulanate (AUGMENTIN) 875-125 MG per tablet 1 tablet        1 tablet Oral Every 12 hours 11/09/21 0749 11/14/21 0959       Objective: Vitals:   11/13/21 0846 11/13/21 1134  BP:  122/79  Pulse:  81  Resp:  15  Temp:  97.6 F (36.4 C)  SpO2: 95% 93%     Intake/Output Summary (Last 24 hours) at 11/13/2021 1314 Last data filed at 11/12/2021 1735 Gross per 24 hour  Intake --  Output 600 ml  Net -600 ml   Filed Weights   11/11/21 0429 11/12/21 0343 11/13/21 0407  Weight: 82.7 kg 82 kg 95.6 kg   Weight change: 13.6 kg Body mass index is 32.05 kg/m.   Physical Exam: General exam: Young Caucasian male not in physical distress Skin: No rashes, lesions or ulcers. HEENT: Atraumatic, normocephalic, no obvious bleeding Lungs: Continues to have diffuse bilateral wheezing with cough on deep breathing CVS: Regular rate and rhythm, no murmur GI/Abd soft, nontender, nondistended, bowel sound present CNS: Alert, awake, oriented x3 Psychiatry: Mood appropriate Extremities: No pedal edema, no calf tenderness  Data Review: I have personally reviewed the laboratory data and studies available.  F/u labs ordered Unresulted Labs (From admission, onward)    None       Signed, Terrilee Croak, MD [facility] 11/13/2021

## 2021-11-13 NOTE — Progress Notes (Signed)
Physical Therapy Treatment Patient Details Name: Jeremiah Yates MRN: [id number] DOB: 1976/07/02 Today's Date: 11/13/2021   History of Present Illness [facility]ub[facility] in [facility] ED on 1/10. Extubated 1/11 but with continued wheezing. Acute encephalopathy of unknown etiology. PMH - asthma.    PT Comments    Pt met his physical therapy goals during his inpatient stay. Ambulating 300 feet with no assistive device independently. SpO2 95% on RA, HR 80-101 bpm. Pt denies dyspnea on exertion. No further acute PT needs. Thank you for this consult.   SATURATION QUALIFICATIONS: (This note is used to comply with regulatory documentation for home oxygen)  Patient Saturations on Room Air at Rest = 95%  Patient Saturations on Room Air while Ambulating = 95%  Patient Saturations on - Liters of oxygen while Ambulating =N/A  Please briefly explain why patient needs home oxygen: Pt does not need oxygen upon discharge   Recommendations for follow up therapy are one component of a multi-disciplinary discharge planning process, led by the attending physician.  Recommendations may be updated based on patient status, additional functional criteria and insurance authorization.  Follow Up Recommendations  No PT follow up     Assistance Recommended at Discharge PRN  Patient can return home with the following Other (comment) (no assist)   Equipment Recommendations  None recommended by PT    Recommendations for Other Services       Precautions / Restrictions Precautions Precautions: Other (comment) Precaution Comments: Curator to unlock ankle cuff Restrictions Weight Bearing Restrictions: No     Mobility  Bed Mobility Overal bed mobility: Modified Independent                  Transfers Overall transfer level: Independent Equipment used: None                    Ambulation/Gait Ambulation/Gait assistance: Independent Gait Distance  (Feet): 300 Feet Assistive device: None Gait Pattern/deviations: Step-through pattern;Decreased stride length;Wide base of support Gait velocity: decr     General Gait Details: ankle cuffs donned, steady pace, no gross imbalance   Stairs             Wheelchair Mobility    Modified Rankin (Stroke Patients Only)       Balance Overall balance assessment: Mild deficits observed, not formally tested                                          Cognition Arousal/Alertness: Awake/alert Behavior During Therapy: Flat affect Overall Cognitive Status: Within Functional Limits for tasks assessed                                          Exercises      General Comments        Pertinent Vitals/Pain Pain Assessment: No/denies pain    Home Living                          Prior Function            PT Goals (current goals can now be found in the care plan section) Acute Rehab PT Goals Patient Stated Goal: did not state PT Goal Formulation: With patient Time For  Goal Achievement: 11/16/21 Potential to Achieve Goals: Good Progress towards PT goals: Progressing toward goals    Frequency    Min 3X/week      PT Plan Current plan remains appropriate    Co-evaluation              AM-PAC PT "6 Clicks" Mobility   Outcome Measure  Help needed turning from your back to your side while in a flat bed without using bedrails?: None Help needed moving from lying on your back to sitting on the side of a flat bed without using bedrails?: None Help needed moving to and from a bed to a chair (including a wheelchair)?: None Help needed standing up from a chair using your arms (e.g., wheelchair or bedside chair)?: None Help needed to walk in hospital room?: None Help needed climbing 3-5 steps with a railing? : A Little 6 Click Score: 23    End of Session   Activity Tolerance: Patient tolerated treatment well Patient left: in  bed;with call bell/phone within reach;with bed alarm set Nurse Communication: Mobility status PT Visit Diagnosis: Unsteadiness on feet (R26.81);Other abnormalities of gait and mobility (R26.89)     Time: 4068-4033 PT Time Calculation (min) (ACUTE ONLY): 13 min  Charges:  $Therapeutic Activity: 8-22 mins                     Wyona Almas, PT, DPT Acute Rehabilitation Services Pager [phone] Office [phone]    Deno Etienne 11/13/2021, 1:40 PM

## 2021-11-14 ENCOUNTER — Inpatient Hospital Stay (HOSPITAL_COMMUNITY)

## 2021-11-14 ENCOUNTER — Encounter (HOSPITAL_COMMUNITY): Admission: EM | Payer: Self-pay | Source: Home / Self Care | Attending: Internal Medicine

## 2021-11-14 ENCOUNTER — Inpatient Hospital Stay (HOSPITAL_COMMUNITY): Admitting: Certified Registered Nurse Anesthetist

## 2021-11-14 ENCOUNTER — Encounter (HOSPITAL_COMMUNITY): Payer: Self-pay | Admitting: Pulmonary Disease

## 2021-11-14 DIAGNOSIS — R7881 Bacteremia: Secondary | ICD-10-CM | POA: Diagnosis not present

## 2021-11-14 DIAGNOSIS — I340 Nonrheumatic mitral (valve) insufficiency: Secondary | ICD-10-CM | POA: Diagnosis not present

## 2021-11-14 HISTORY — PX: TEE WITHOUT CARDIOVERSION: SHX5443

## 2021-11-14 LAB — GLUCOSE, CAPILLARY
Glucose-Capillary: 109 mg/dL — ABNORMAL HIGH (ref 70–99)
Glucose-Capillary: 104 mg/dL — ABNORMAL HIGH (ref 70–99)

## 2021-11-14 SURGERY — ECHOCARDIOGRAM, TRANSESOPHAGEAL
Anesthesia: Monitor Anesthesia Care

## 2021-11-14 MED ORDER — LABETALOL HCL 5 MG/ML IV SOLN
INTRAVENOUS | Status: AC
  Filled 2021-11-14: qty 4

## 2021-11-14 MED ORDER — SODIUM CHLORIDE 0.9 % IV SOLN: INTRAVENOUS | Status: DC | PRN

## 2021-11-14 MED ORDER — PHENYLEPHRINE 40 MCG/ML (10ML) SYRINGE FOR IV PUSH (FOR BLOOD PRESSURE SUPPORT)
PREFILLED_SYRINGE | INTRAVENOUS | Status: DC | PRN
  Administered 2021-11-14: 40 ug via INTRAVENOUS
  Administered 2021-11-14: 80 ug via INTRAVENOUS

## 2021-11-14 MED ORDER — PROPOFOL 500 MG/50ML IV EMUL
INTRAVENOUS | Status: DC | PRN
  Administered 2021-11-14: 150 ug/kg/min via INTRAVENOUS

## 2021-11-14 MED ORDER — ALBUTEROL SULFATE HFA 108 (90 BASE) MCG/ACT IN AERS: 2.0000 | INHALATION_SPRAY | RESPIRATORY_TRACT | 0 refills | Status: DC | PRN

## 2021-11-14 MED ORDER — BUDESONIDE-FORMOTEROL FUMARATE 80-4.5 MCG/ACT IN AERO: 2.0000 | INHALATION_SPRAY | Freq: Two times a day (BID) | RESPIRATORY_TRACT | 0 refills | Status: DC

## 2021-11-14 MED ORDER — BUDESONIDE-FORMOTEROL FUMARATE 80-4.5 MCG/ACT IN AERO: 2.0000 | INHALATION_SPRAY | Freq: Two times a day (BID) | RESPIRATORY_TRACT | 0 refills | Status: AC

## 2021-11-14 MED ORDER — PREDNISONE 20 MG PO TABS: 20.0000 mg | ORAL_TABLET | Freq: Every day | ORAL | 0 refills | Status: AC

## 2021-11-14 MED ORDER — ALBUTEROL SULFATE HFA 108 (90 BASE) MCG/ACT IN AERS: 2.0000 | INHALATION_SPRAY | RESPIRATORY_TRACT | 0 refills | Status: AC | PRN

## 2021-11-14 MED ORDER — LABETALOL HCL 5 MG/ML IV SOLN
5.0000 mg | Freq: Once | INTRAVENOUS | Status: AC
  Administered 2021-11-14: 5 mg via INTRAVENOUS

## 2021-11-14 MED ORDER — LIDOCAINE 2% (20 MG/ML) 5 ML SYRINGE
INTRAMUSCULAR | Status: DC | PRN
  Administered 2021-11-14: 80 mg via INTRAVENOUS

## 2021-11-14 MED ORDER — SODIUM CHLORIDE 0.9 % IV SOLN: INTRAVENOUS | Status: DC

## 2021-11-14 MED ORDER — PROPOFOL 10 MG/ML IV BOLUS
INTRAVENOUS | Status: DC | PRN
  Administered 2021-11-14 (×2): 10 mg via INTRAVENOUS

## 2021-11-14 NOTE — Anesthesia Procedure Notes (Signed)
Procedure Name: MAC Date/Time: 11/14/2021 8:39 AM Performed by: Janene Harvey, CRNA Pre-anesthesia Checklist: Patient identified, Emergency Drugs available, Suction available and Patient being monitored Patient Re-evaluated:Patient Re-evaluated prior to induction Oxygen Delivery Method: Simple face mask Induction Type: IV induction Placement Confirmation: positive ETCO2 Dental Injury: Teeth and Oropharynx as per pre-operative assessment

## 2021-11-14 NOTE — Discharge Summary (Signed)
Physician Discharge Summary  Jeremiah Yates ZOX:[id number] DOB: November 13, 1975 DOA: 11/07/2021  PCP: Patient, No Pcp Per (Inactive)  Admit date: 11/07/2021 Discharge date: 11/14/2021  Admitted From: [facility] Discharge disposition: Jail   Code Status: Full Code   Discharge Diagnosis:  Principal Problem:   Unresponsive Active Problems:   Altered mental status   Respiratory failure with hypoxia (HCC)   Acute encephalopathy   Chief complaint: Unresponsiveness   Brief narrative: Jeremiah Yates is a 46 y.o. male with PMH significant for HTN, asthma, nephrolithiasis Patient [facility] the prison to ED at [facility] on 11/07/2021 for unresponsiveness.  He did not respond to Narcan.   Intubated at [facility] ED.  CT head unremarkable. Transferred to [facility] ICU for neuro eval and imaging. Extubated on 1/11, transferred to [facility] on 1/13 See below for details  Subjective: Patient was seen and examined this morning.   Lying on bed.  Not in distress.  No new symptoms.  Not on supplemental oxygen.  Underwent TEE today.  Feels comfortable with discharge.  Officer at bedside.  Scripts printed.  Assessment/Plan: Acute encephalopathy -Brought in for unresponsiveness, unclear etiology.  -Did not respond to Narcan. UDS negative.  CT head, CT angio head and neck, EEG, MRI all negative.  Ammonia and TSH level normal. -Mental status gradually improved to normal. -Currently not on any mood altering medications.  Acute hypoxemic respiratory failure History of asthma -Intubated in the ED for unresponsiveness.  Extubated the next day on 1/11. -Post extubation, patient was noted to have wheezing, required Solu-Medrol, racemic epinephrine. -Continue bronchodilators, prednisone 40 mg daily. -At discharge, continue prednisone tapering course.  Continue risk inhaler.  Strep bacteremia Possible aortic valve vegetation -1 out of 2 blood cultures obtained on admission grew Streptococcus. -Initially felt to be  contaminant but with leukocytosis, patient was started on Augmentin for 5 days.  Completed the course. -WBC count improved.  No fever -Repeat blood cultures sent on 1/13 did not see any growth. -Echocardiogram 1/13 showed EF of 55 to 60%, and echo bright density in the aortic valve measuring 0.6 to 0.8 cm.  Cannot rule out vegetation.   -TEE obtained today 1/17 did not show any evidence of vegetation in the aortic valve. Recent Labs  Lab 11/07/21 1940 11/07/21 2014 11/08/21 0257 11/09/21 0435 11/10/21 0224 11/11/21 0058  WBC 14.6*  --  15.7* 20.2* 19.8* 15.3*  LATICACIDVEN  --  2.0*  --   --   --   --    Mobility: Able to ambulate without assistance.  Encourage ambulation. Living condition: Prison Goals of care:   Code Status: Full Code  Nutritional status: Body mass index is 31.95 kg/m.      Discharge Medications:   Allergies as of 11/14/2021   No Known Allergies      Medication List     STOP taking these medications    tamsulosin 0.4 MG Caps capsule Commonly known as: FLOMAX       TAKE these medications    albuterol 108 (90 Base) MCG/ACT inhaler Commonly known as: VENTOLIN HFA Inhale 2 puffs into the lungs every 4 (four) hours as needed for wheezing or shortness of breath.   budesonide-formoterol 80-4.5 MCG/ACT inhaler Commonly known as: Symbicort Inhale 2 puffs into the lungs 2 (two) times daily.   predniSONE 20 MG tablet Commonly known as: DELTASONE Take 1 tablet (20 mg total) by mouth daily with breakfast for 5 days. Start taking on: November 16, 2021 Notes to  patient: Take tomorrow morning 11/15/20        Wound care:     Discharge Instructions:   Discharge Instructions     Call MD for:  difficulty breathing, headache or visual disturbances   Complete by: As directed    Call MD for:  extreme fatigue   Complete by: As directed    Call MD for:  hives   Complete by: As directed    Call MD for:  persistant dizziness or light-headedness    Complete by: As directed    Call MD for:  persistant nausea and vomiting   Complete by: As directed    Call MD for:  severe uncontrolled pain   Complete by: As directed    Call MD for:  temperature >100.4   Complete by: As directed    Diet general   Complete by: As directed    Discharge instructions   Complete by: As directed    General discharge instructions:  Follow with Primary MD Patient, No Pcp Per (Inactive) in 7 days   Get CBC/BMP checked in next visit within 1 week by PCP or SNF MD. (We routinely change or add medications that can affect your baseline labs and fluid status, therefore we recommend that you get the mentioned basic workup next visit with your PCP, your PCP may decide not to get them or add new tests based on their clinical decision)  On your next visit with your PCP, please get your medicines reviewed and adjusted.  Please request your PCP  to go over all hospital tests, procedures, radiology results at the follow up, please get all Hospital records sent to your PCP by signing hospital release before you go home.  Activity: As tolerated with Full fall precautions use walker/cane & assistance as needed  Avoid using any recreational substances like cigarette, tobacco, alcohol, or non-prescribed drug.  If you experience worsening of your admission symptoms, develop shortness of breath, life threatening emergency, suicidal or homicidal thoughts you must seek medical attention immediately by calling 911 or calling your MD immediately  if symptoms less severe.  You must read complete instructions/literature along with all the possible adverse reactions/side effects for all the medicines you take and that have been prescribed to you. Take any new medicine only after you have completely understood and accepted all the possible adverse reactions/side effects.   Do not drive, operate heavy machinery, perform activities at heights, swimming or participation in water  activities or provide baby sitting services if your were admitted for syncope or siezures until you have seen by Primary MD or a Neurologist and advised to do so again.  Do not drive when taking Pain medications.  Do not take more than prescribed Pain, Sleep and Anxiety Medications  Wear Seat belts while driving.  Please note You were cared for by a hospitalist during your hospital stay. If you have any questions about your discharge medications or the care you received while you were in the hospital after you are discharged, you can call the unit and asked to speak with the hospitalist on call if the hospitalist that took care of you is not available. Once you are discharged, your primary care physician will handle any further medical issues. Please note that NO REFILLS for any discharge medications will be authorized once you are discharged, as it is imperative that you return to your primary care physician (or establish a relationship with a primary care physician if you do not have one) for  your aftercare needs so that they can reassess your need for medications and monitor your lab values.   Increase activity slowly   Complete by: As directed        Follow ups:    Follow-up Information     [facility] Follow up.   Contact information: [address] [place] [phone]                Discharge Exam:   Vitals:   11/14/21 0820 11/14/21 0910 11/14/21 0925 11/14/21 1001  BP: (!) 159/113 125/85  (!) 167/112  Pulse:  74  72  Resp:  15  16  Temp:  98.7 F (37.1 C) 98.6 F (37 C) 97.8 F (36.6 C)  TempSrc:    Oral  SpO2:  97% 97% 91%  Weight:      Height:        Body mass index is 31.95 kg/m.  General exam: Young Caucasian male not in physical distress Skin: No rashes, lesions or ulcers. HEENT: Atraumatic, normocephalic, no obvious bleeding Lungs: Improved wheezing and rhonchi.  Not on supplemental  oxygen CVS: Regular rate and rhythm, no murmur GI/Abd soft, nontender, nondistended, bowel sound present CNS: Alert, awake, oriented x3 Psychiatry: Mood appropriate Extremities: No pedal edema, no calf tenderness  Time coordinating discharge: 35 minutes   The results of significant diagnostics from this hospitalization (including imaging, microbiology, ancillary and laboratory) are listed below for reference.    Procedures and Diagnostic Studies:   CT ANGIO HEAD NECK W WO CM  Result Date: 11/08/2021 CLINICAL DATA:  Initial evaluation for neuro deficit, stroke suspected. EXAM: CT ANGIOGRAPHY HEAD AND NECK TECHNIQUE: Multidetector CT imaging of the head and neck was performed using the standard protocol during bolus administration of intravenous contrast. Multiplanar CT image reconstructions and MIPs were obtained to evaluate the vascular anatomy. Carotid stenosis measurements (when applicable) are obtained utilizing NASCET criteria, using the distal internal carotid diameter as the denominator. RADIATION DOSE REDUCTION: This exam was performed according to the departmental dose-optimization program which includes automated exposure control, adjustment of the mA and/or kV according to patient size and/or use of iterative reconstruction technique. CONTRAST:  100mL OMNIPAQUE IOHEXOL 350 MG/ML SOLN COMPARISON:  Head CT from 11/07/2021 FINDINGS: CTA NECK FINDINGS Aortic arch: Visualized aortic arch normal caliber with normal branch pattern. No stenosis about the origin the great vessels. Right carotid system: Right common and internal carotid arteries widely patent without stenosis, dissection or occlusion. Left carotid system: Left common and internal carotid arteries widely patent without stenosis, dissection or occlusion. Vertebral arteries: Left vertebral artery arises directly from the aortic arch. Right vertebral artery dominant. Vertebral arteries patent without stenosis or dissection. Skeleton: No  discrete or worrisome osseous lesions. Poor dentition noted. Other neck: Endotracheal and enteric tubes in place. No other acute soft tissue abnormality within the neck. 6 mm right thyroid nodule, of doubtful significance given size and patient age, no follow-up imaging recommended (ref: J Am Coll Radiol. 2015 Feb;12(2): 143-50). Upper chest: Better evaluated on concomitant CT of the chest. Scattered atelectatic changes noted within the visualized lungs. Visualized upper chest demonstrates no other acute finding. Review of the MIP images confirms the above findings CTA HEAD FINDINGS Anterior circulation: Both internal carotid arteries widely patent to the termini without stenosis. A1 segments widely patent. Normal anterior communicating artery complex. Both anterior cerebral arteries widely patent to their distal aspects without stenosis. No M1 stenosis or occlusion. Normal MCA  bifurcations. Distal MCA branches well perfused and symmetric. Posterior circulation: Both V4 segments patent to the vertebrobasilar junction without stenosis. Neither PICA origin well visualized. Basilar widely patent to its distal aspect without stenosis. Superior cerebellar arteries patent bilaterally. Both PCAs primarily supplied via the basilar and are well perfused to there distal aspects. Venous sinuses: Grossly patent allowing for timing the contrast bolus. Anatomic variants: None significant.  No aneurysm. Review of the MIP images confirms the above findings IMPRESSION: Negative CTA of the head and neck. No large vessel occlusion, hemodynamically significant stenosis, or other acute vascular abnormality. Electronically Signed   By: Rise MuBenjamin  McClintock M.D.   On: 11/08/2021 01:30   DG Abd 1 View  Result Date: 11/08/2021 CLINICAL DATA:  Rule out metallic foreign body prior to MRI. EXAM: ABDOMEN - 1 VIEW COMPARISON:  None. FINDINGS: Normal bowel gas pattern. Excreted contrast in both renal collecting systems, proximal ureters and  urinary bladder. Foley catheter in the bladder. Nasogastric tube tip in the distal stomach. No metallic foreign bodies are seen. Nonaggressive appearing lucent and sclerotic lesion in the left superior acetabulum. IMPRESSION: No metallic foreign body or acute abnormality. Electronically Signed   By: Beckie SaltsSteven  Reid M.D.   On: 11/08/2021 08:43   CT HEAD WO CONTRAST (5MM)  Result Date: 11/07/2021 CLINICAL DATA:  Mental status change, unknown cause. Found unresponsive EXAM: CT HEAD WITHOUT CONTRAST TECHNIQUE: Contiguous axial images were obtained from the base of the skull through the vertex without intravenous contrast. RADIATION DOSE REDUCTION: This exam was performed according to the departmental dose-optimization program which includes automated exposure control, adjustment of the mA and/or kV according to patient size and/or use of iterative reconstruction technique. COMPARISON:  None. FINDINGS: Brain: No acute intracranial abnormality. Specifically, no hemorrhage, hydrocephalus, mass lesion, acute infarction, or significant intracranial injury. Vascular: No hyperdense vessel or unexpected calcification. Skull: No acute calvarial abnormality. Sinuses/Orbits: Diffuse mucosal thickening. Air-fluid level in the right maxillary sinus. Other: None IMPRESSION: No acute intracranial abnormality. Acute on chronic sinusitis. Electronically Signed   By: Charlett NoseKevin  Dover M.D.   On: 11/07/2021 20:34   CT CHEST WO CONTRAST  Result Date: 11/08/2021 CLINICAL DATA:  Unresponsiveness. EXAM: CT CHEST WITHOUT CONTRAST TECHNIQUE: Multidetector CT imaging of the chest was performed following the standard protocol without IV contrast. RADIATION DOSE REDUCTION: This exam was performed according to the departmental dose-optimization program which includes automated exposure control, adjustment of the mA and/or kV according to patient size and/or use of iterative reconstruction technique. COMPARISON:  Chest radiograph dated 11/07/2021.  FINDINGS: Evaluation of this exam is limited in the absence of intravenous contrast. Cardiovascular: There is no cardiomegaly or pericardial effusion. The thoracic aorta and central pulmonary arteries are grossly unremarkable on this noncontrast CT. Mediastinum/Nodes: No hilar or mediastinal adenopathy. An enteric tube is noted within the esophagus. No mediastinal fluid collection. Lungs/Pleura: Evaluation of the lungs is limited due to respiratory motion artifact. Diffuse interstitial prominence, likely chronic. Bibasilar subpleural atelectasis. No pleural effusion pneumothorax. The central airways are patent. Endotracheal tube with tip approximately 2 cm above the carina. Upper Abdomen: Rounded splenic tissue in the left upper abdomen. Musculoskeletal: No acute osseous pathology. IMPRESSION: No acute intrathoracic pathology. Electronically Signed   By: Elgie CollardArash  Radparvar M.D.   On: 11/08/2021 00:22   MR BRAIN WO CONTRAST  Result Date: 11/08/2021 CLINICAL DATA:  Mental status change, unknown cause. EXAM: MRI HEAD WITHOUT CONTRAST TECHNIQUE: Multiplanar, multiecho pulse sequences of the brain and surrounding structures were obtained without intravenous contrast.  COMPARISON:  Head CT November 07, 2021. FINDINGS: Brain: No acute infarction, hemorrhage, hydrocephalus, extra-axial collection or mass lesion.Few scattered foci of T2 hyperintensity are seen within the white matter of the cerebral hemispheres, nonspecific. Vascular: Normal flow voids. Skull and upper cervical spine: Normal marrow signal. Sinuses/Orbits: Mucosal thickening throughout the paranasal sinuses with fluid level within the right maxillary sinus. The orbits are maintained. Other: None. IMPRESSION: 1. No acute intracranial abnormality. 2. Minimal amount of nonspecific T2 hyperintense lesions of the white matter, may represent early chronic microangiopathy, migraines related, demyelination or sequela of prior inflammatory/infectious process.  Electronically Signed   By: Baldemar Lenis M.D.   On: 11/08/2021 11:19   DG Chest Portable 1 View  Result Date: 11/07/2021 CLINICAL DATA:  Intubation. EXAM: PORTABLE CHEST 1 VIEW COMPARISON:  Chest radiograph dated 10/13/2020. FINDINGS: Endotracheal tube with tip approximately 3 cm above the carina. Enteric tube extends below the diaphragm with tip in the body of the stomach. There is shallow inspiration. Diffuse interstitial coarsening and nodularity similar to prior radiograph and may be chronic. Mild edema is not excluded. No focal consolidation, pleural effusion, or pneumothorax. Stable cardiac silhouette. No acute osseous pathology. IMPRESSION: 1. Endotracheal tube with tip above the carina. 2. Enteric tube with tip in the body of the stomach. 3. Stable diffuse interstitial coarsening and nodularity similar to prior radiograph. Electronically Signed   By: Elgie Collard M.D.   On: 11/07/2021 20:10   EEG adult  Result Date: 11/08/2021 Charlsie Quest, MD     11/08/2021  3:32 PM Patient Name: KINGSTYN DERUITER MRN: [id number] Epilepsy Attending: Charlsie Quest Referring Physician/Provider: Dr Milon Dikes Date: 11/08/2021 Duration: 21.34 mins Patient history: 46 year old with above past medical history brought in from his jail cell after being found unresponsive. EEG to evaluate for seizure Level of alertness: Awake, asleep/sedated AEDs during EEG study: Propofol Technical aspects: This EEG study was done with scalp electrodes positioned according to the 10-20 International system of electrode placement. Electrical activity was acquired at a sampling rate of 500Hz  and reviewed with a high frequency filter of 70Hz  and a low frequency filter of 1Hz . EEG data were recorded continuously and digitally stored. Description: The posterior dominant rhythm consists of 8 Hz activity of moderate voltage (25-35 uV) seen predominantly in posterior head regions, symmetric and reactive to eye opening and eye  closing. As propofol was increased, EEG showed generalized 15-18hz  beta activity. Hyperventilation and photic stimulation were not performed.   ABNORMALITY - Excessive beta, generalized IMPRESSION: This study is within normal limits. The excessive beta activity seen in the background is most likely due to the effect of medications like benzodiazepine and is a benign EEG pattern. No seizures or epileptiform discharges were seen throughout the recording. Charlsie Quest     Labs:   Basic Metabolic Panel: Recent Labs  Lab 11/07/21 1940 11/08/21 0257 11/09/21 0107 11/09/21 0435 11/10/21 0224 11/11/21 0058  NA 137 136 144 142 143 139  K 3.8 4.2 4.3 4.2 3.9 4.2  CL 104 104  --  109 109 104  CO2 22 23  --  24 26 27   GLUCOSE 178* 146*  --  145* 110* 92  BUN 13 9  --  17 22* 21*  CREATININE 1.12 1.03  --  1.16 0.95 0.92  CALCIUM 9.0 9.0  --  9.2 8.8* 8.7*  MG 1.9 1.8  --  2.6* 2.3  --   PHOS  --   --   --  4.7* 4.6  --    GFR Estimated Creatinine Clearance: 116 mL/min (by C-G formula based on SCr of 0.92 mg/dL). Liver Function Tests: Recent Labs  Lab 11/07/21 1940 11/08/21 0257  AST 21 19  ALT 29 25  ALKPHOS 73 65  BILITOT 0.6 0.7  PROT 7.7 6.4*  ALBUMIN 4.1 3.3*   No results for input(s): LIPASE, AMYLASE in the last 168 hours. Recent Labs  Lab 11/08/21 0257  AMMONIA 19   Coagulation profile No results for input(s): INR, PROTIME in the last 168 hours.  CBC: Recent Labs  Lab 11/07/21 1940 11/08/21 0257 11/09/21 0107 11/09/21 0435 11/10/21 0224 11/11/21 0058  WBC 14.6* 15.7*  --  20.2* 19.8* 15.3*  NEUTROABS 11.6*  --   --   --   --  10.5*  HGB 15.6 14.4 15.0 15.1 14.3 13.9  HCT 47.8 43.8 44.0 45.9 43.0 43.6  MCV 96.6 93.2  --  93.1 93.5 95.6  PLT 278 275  --  300 291 269   Cardiac Enzymes: No results for input(s): CKTOTAL, CKMB, CKMBINDEX, TROPONINI in the last 168 hours. BNP: Invalid input(s): POCBNP CBG: Recent Labs  Lab 11/13/21 1645 11/13/21 1913  11/13/21 2324 11/14/21 0330 11/14/21 1000  GLUCAP 151* 116* 107* 109* 104*   D-Dimer No results for input(s): DDIMER in the last 72 hours. Hgb A1c No results for input(s): HGBA1C in the last 72 hours. Lipid Profile No results for input(s): CHOL, HDL, LDLCALC, TRIG, CHOLHDL, LDLDIRECT in the last 72 hours. Thyroid function studies No results for input(s): TSH, T4TOTAL, T3FREE, THYROIDAB in the last 72 hours.  Invalid input(s): FREET3 Anemia work up No results for input(s): VITAMINB12, FOLATE, FERRITIN, TIBC, IRON, RETICCTPCT in the last 72 hours. Microbiology Recent Results (from the past 240 hour(s))  Blood Cultures (routine x 2)     Status: Abnormal   Collection Time: 11/07/21  8:14 PM   Specimen: BLOOD RIGHT HAND  Result Value Ref Range Status   Specimen Description   Final    BLOOD RIGHT HAND Performed at [facility], [address] [place], [place] [address]    Special Requests   Final    BOTTLES DRAWN AEROBIC AND ANAEROBIC Blood Culture adequate volume Performed at [facility], [address], [place], [place] [address]    Culture  Setup Time   Final    GRAM POSITIVE COCCI Gram Stain Report Called to,Read Back By and Verified With: LEWIS A. AT [facility] AT 1506 ON [id number] BY THOMPSON S. ANAEROBIC BOTTLE ONLY CRITICAL RESULT CALLED TO, READ BACK BY AND VERIFIED WITH: PHARMD CAREN AMEND 11/08/2021 2143  BY JW    Culture (A)  Final    STREPTOCOCCUS SANGUINIS THE SIGNIFICANCE OF ISOLATING THIS ORGANISM FROM A SINGLE SET OF BLOOD CULTURES WHEN MULTIPLE SETS ARE DRAWN IS UNCERTAIN. PLEASE NOTIFY THE MICROBIOLOGY DEPARTMENT WITHIN ONE WEEK IF SPECIATION AND SENSITIVITIES ARE REQUIRED. Performed at [facility], [address] [place], [place] [address]    Report Status 11/10/2021 FINAL  Final  Blood Cultures (routine x 2)     Status: None   Collection Time: 11/07/21  8:14 PM   Specimen: Left Antecubital; Blood  Result Value Ref Range Status   Specimen Description LEFT  ANTECUBITAL  Final   Special Requests   Final    BOTTLES DRAWN AEROBIC AND ANAEROBIC Blood Culture adequate volume   Culture   Final    NO GROWTH 5 DAYS Performed at [facility], [address]  [place], [place] [address]    Report Status 11/12/2021 FINAL  Final  Blood Culture ID Panel (Reflexed)     Status: Abnormal   Collection Time: 11/07/21  8:14 PM  Result Value Ref Range Status   Enterococcus faecalis NOT DETECTED NOT DETECTED Final   Enterococcus Faecium NOT DETECTED NOT DETECTED Final   Listeria monocytogenes NOT DETECTED NOT DETECTED Final   Staphylococcus species NOT DETECTED NOT DETECTED Final   Staphylococcus aureus (BCID) NOT DETECTED NOT DETECTED Final   Staphylococcus epidermidis NOT DETECTED NOT DETECTED Final   Staphylococcus lugdunensis NOT DETECTED NOT DETECTED Final   Streptococcus species DETECTED (A) NOT DETECTED Final    Comment: Not Enterococcus species, Streptococcus agalactiae, Streptococcus pyogenes, or Streptococcus pneumoniae. CRITICAL RESULT CALLED TO, READ BACK BY AND VERIFIED WITH: PHARMD CAREN AMEND 11/08/2021 2143  BY JW    Streptococcus agalactiae NOT DETECTED NOT DETECTED Final   Streptococcus pneumoniae NOT DETECTED NOT DETECTED Final   Streptococcus pyogenes NOT DETECTED NOT DETECTED Final   A.calcoaceticus-baumannii NOT DETECTED NOT DETECTED Final   Bacteroides fragilis NOT DETECTED NOT DETECTED Final   Enterobacterales NOT DETECTED NOT DETECTED Final   Enterobacter cloacae complex NOT DETECTED NOT DETECTED Final   Escherichia coli NOT DETECTED NOT DETECTED Final   Klebsiella aerogenes NOT DETECTED NOT DETECTED Final   Klebsiella oxytoca NOT DETECTED NOT DETECTED Final   Klebsiella pneumoniae NOT DETECTED NOT DETECTED Final   Proteus species NOT DETECTED NOT DETECTED Final   Salmonella species NOT DETECTED NOT DETECTED Final   Serratia marcescens NOT DETECTED NOT DETECTED Final   Haemophilus influenzae NOT DETECTED NOT DETECTED Final    Neisseria meningitidis NOT DETECTED NOT DETECTED Final   Pseudomonas aeruginosa NOT DETECTED NOT DETECTED Final   Stenotrophomonas maltophilia NOT DETECTED NOT DETECTED Final   Candida albicans NOT DETECTED NOT DETECTED Final   Candida auris NOT DETECTED NOT DETECTED Final   Candida glabrata NOT DETECTED NOT DETECTED Final   Candida krusei NOT DETECTED NOT DETECTED Final   Candida parapsilosis NOT DETECTED NOT DETECTED Final   Candida tropicalis NOT DETECTED NOT DETECTED Final   Cryptococcus neoformans/gattii NOT DETECTED NOT DETECTED Final    Comment: Performed at [facility], [address] [place], [place] [address]  Resp Panel by RT-PCR (Flu A&B, Covid) Nasopharyngeal Swab     Status: None   Collection Time: 11/07/21  8:44 PM   Specimen: Nasopharyngeal Swab; Nasopharyngeal(NP) swabs in vial transport medium  Result Value Ref Range Status   SARS Coronavirus 2 by RT PCR NEGATIVE NEGATIVE Final    Comment: (NOTE) SARS-CoV-2 target nucleic acids are NOT DETECTED.  The SARS-CoV-2 RNA is generally detectable in upper respiratory specimens during the acute phase of infection. The lowest concentration of SARS-CoV-2 viral copies this assay can detect is 138 copies/mL. A negative result does not preclude SARS-Cov-2 infection and should not be used as the sole basis for treatment or other patient management decisions. A negative result may occur with  improper specimen collection/handling, submission of specimen other than nasopharyngeal swab, presence of viral mutation(s) within the areas targeted by this assay, and inadequate number of viral copies(<138 copies/mL). A negative result must be combined with clinical observations, patient history, and epidemiological information. The expected result is Negative.  Fact Sheet for Patients:  BloggerCourse.com  Fact Sheet for Healthcare Providers:  SeriousBroker.it  This test is no t  yet approved or cleared by the [place] FDA and  has been authorized for detection and/or diagnosis of  SARS-CoV-2 by FDA under an Emergency Use Authorization (EUA). This EUA will remain  in effect (meaning this test can be used) for the duration of the COVID-19 declaration under Section 564(b)(1) of the Act, 21 U.S.C.section 360bbb-3(b)(1), unless the authorization is terminated  or revoked sooner.       Influenza A by PCR NEGATIVE NEGATIVE Final   Influenza B by PCR NEGATIVE NEGATIVE Final    Comment: (NOTE) The Xpert Xpress SARS-CoV-2/FLU/RSV plus assay is intended as an aid in the diagnosis of influenza from Nasopharyngeal swab specimens and should not be used as a sole basis for treatment. Nasal washings and aspirates are unacceptable for Xpert Xpress SARS-CoV-2/FLU/RSV testing.  Fact Sheet for Patients: BloggerCourse.com  Fact Sheet for Healthcare Providers: SeriousBroker.it  This test is not yet approved or cleared by the [place] FDA and has been authorized for detection and/or diagnosis of SARS-CoV-2 by FDA under an Emergency Use Authorization (EUA). This EUA will remain in effect (meaning this test can be used) for the duration of the COVID-19 declaration under Section 564(b)(1) of the Act, 21 U.S.C. section 360bbb-3(b)(1), unless the authorization is terminated or revoked.  Performed at [facility], [address], [place], [place] [address]   MRSA Next Gen by PCR, Nasal     Status: None   Collection Time: 11/08/21  1:39 AM   Specimen: Nasal Mucosa; Nasal Swab  Result Value Ref Range Status   MRSA by PCR Next Gen NOT DETECTED NOT DETECTED Final    Comment: (NOTE) The GeneXpert MRSA Assay (FDA approved for NASAL specimens only), is one component of a comprehensive MRSA colonization surveillance program. It is not intended to diagnose MRSA infection nor to guide or monitor treatment for MRSA  infections. Test performance is not FDA approved in patients less than 13 years old. Performed at [facility], [address], [place], [place] [address]   Culture, blood (routine x 2)     Status: None (Preliminary result)   Collection Time: 11/10/21 10:25 AM   Specimen: BLOOD  Result Value Ref Range Status   Specimen Description BLOOD LEFT ANTECUBITAL  Final   Special Requests   Final    BOTTLES DRAWN AEROBIC AND ANAEROBIC Blood Culture adequate volume   Culture   Final    NO GROWTH 4 DAYS Performed at [facility], [address], [place], [place] [address]    Report Status PENDING  Incomplete  Culture, blood (routine x 2)     Status: None (Preliminary result)   Collection Time: 11/10/21 10:25 AM   Specimen: BLOOD  Result Value Ref Range Status   Specimen Description BLOOD BLOOD RIGHT HAND  Final   Special Requests AB Blood Culture adequate volume  Final   Culture   Final    NO GROWTH 4 DAYS Performed at [facility], [address] [place], [place] [address]    Report Status PENDING  Incomplete     Signed: Melina Schools Tinna Kolker  [facility] 11/14/2021, 12:35 PM

## 2021-11-14 NOTE — Progress Notes (Signed)
°  Echocardiogram Echocardiogram Transesophageal has been performed.  Jeremiah Yates 11/14/2021, 9:34 AM

## 2021-11-14 NOTE — Anesthesia Postprocedure Evaluation (Signed)
Anesthesia Post Note  Patient: Jeremiah Yates  Procedure(s) Performed: TRANSESOPHAGEAL ECHOCARDIOGRAM (TEE)     Patient location during evaluation: PACU Anesthesia Type: MAC Level of consciousness: awake and alert Pain management: pain level controlled Vital Signs Assessment: post-procedure vital signs reviewed and stable Respiratory status: spontaneous breathing, nonlabored ventilation, respiratory function stable and patient connected to nasal cannula oxygen Cardiovascular status: stable and blood pressure returned to baseline Postop Assessment: no apparent nausea or vomiting Anesthetic complications: no   No notable events documented.  Last Vitals:  Vitals:   11/14/21 0925 11/14/21 1001  BP:  (!) 167/112  Pulse:  72  Resp:  16  Temp: 37 C 36.6 C  SpO2: 97% 91%    Last Pain:  Vitals:   11/14/21 1001  TempSrc: Oral  PainSc:                  Tiajuana Amass

## 2021-11-14 NOTE — Anesthesia Preprocedure Evaluation (Signed)
Anesthesia Evaluation  Patient identified by MRN, date of birth, ID band Patient awake    Reviewed: Allergy & Precautions, NPO status , Patient's Chart, lab work & pertinent test results  Airway Mallampati: II  TM Distance: >3 FB Neck ROM: Full    Dental   Pulmonary asthma ,    breath sounds clear to auscultation       Cardiovascular hypertension, Pt. on medications  Rhythm:Regular Rate:Normal     Neuro/Psych negative neurological ROS     GI/Hepatic negative GI ROS, Neg liver ROS,   Endo/Other  negative endocrine ROS  Renal/GU Renal disease     Musculoskeletal   Abdominal   Peds  Hematology negative hematology ROS (+)   Anesthesia Other Findings   Reproductive/Obstetrics                             Anesthesia Physical Anesthesia Plan  ASA: 3  Anesthesia Plan: MAC   Post-op Pain Management: Minimal or no pain anticipated   Induction:   PONV Risk Score and Plan: 1 and Propofol infusion and Treatment may vary due to age or medical condition  Airway Management Planned: Natural Airway and Nasal Cannula  Additional Equipment:   Intra-op Plan:   Post-operative Plan:   Informed Consent: I have reviewed the patients History and Physical, chart, labs and discussed the procedure including the risks, benefits and alternatives for the proposed anesthesia with the patient or authorized representative who has indicated his/her understanding and acceptance.       Plan Discussed with:   Anesthesia Plan Comments:         Anesthesia Quick Evaluation

## 2021-11-14 NOTE — Interval H&P Note (Signed)
History and Physical Interval Note:  11/14/2021 8:33 AM  Jeremiah Yates  has presented today for surgery, with the diagnosis of BACTEREMIA.  The various methods of treatment have been discussed with the patient and family. After consideration of risks, benefits and other options for treatment, the patient has consented to  Procedure(s): TRANSESOPHAGEAL ECHOCARDIOGRAM (TEE) (N/A) as a surgical intervention.  The patient's history has been reviewed, patient examined, no change in status, stable for surgery.  I have reviewed the patient's chart and labs.  Questions were answered to the patient's satisfaction.     [employer]

## 2021-11-14 NOTE — Transfer of Care (Signed)
Immediate Anesthesia Transfer of Care Note  Patient: Jeremiah Yates  Procedure(s) Performed: TRANSESOPHAGEAL ECHOCARDIOGRAM (TEE)  Patient Location: PACU  Anesthesia Type:MAC  Level of Consciousness: drowsy and patient cooperative  Airway & Oxygen Therapy: Patient Spontanous Breathing and Patient connected to face mask oxygen  Post-op Assessment: Report given to RN and Post -op Vital signs reviewed and stable  Post vital signs: Reviewed and stable  Last Vitals:  Vitals Value Taken Time  BP    Temp    Pulse    Resp    SpO2      Last Pain:  Vitals:   11/14/21 0750  TempSrc: Temporal  PainSc: 0-No pain         Complications: No notable events documented.

## 2021-11-14 NOTE — TOC Transition Note (Signed)
Transition of Care [facility]) - CM/SW Discharge Note   Patient Details  Name: ANTONIN MEININGER MRN: [id number] Date of Birth: [birth year]  Transition of Care [employer]) CM/SW Contact:  Lawerance Sabal, RN Phone Number: 11/14/2021, 12:08 PM   Clinical Narrative:   Spoke w guard at bedside. Patient will [facility], [phone]. Sp[facility] w Joe RN at jail, requested DC note be faxed to [phone]    Final next level of care: [facility] Barriers to Discharge: No Barriers Identified   Patient Goals and CMS Choice Patient states their goals for this hospitalization and ongoing recovery are:: NA      Discharge Placement                       Discharge Plan and Services                                     Social Determinants of Health (SDOH) Interventions     Readmission Risk Interventions No flowsheet data found.

## 2021-11-14 NOTE — CV Procedure (Signed)
° °  Transesophageal Echocardiogram  Indications: ? Vegetation  Time out performed  During this procedure the patient was administered propofol under anesthesiology supervision to achieve and maintain moderate sedation.  The patient's heart rate, blood pressure, and oxygen saturation are monitored continuously during the procedure.   Findings:  Left Ventricle: Normal 60% EF  Mitral Valve: Mild A2 prolapse, trace MR  Aortic Valve: Normal, no AI, no mass, no vegetation (this was area of concern of TTE)  Tricuspid Valve: Normal no TR  Left Atrium: Normal, no left atrial appendage thrombus  Right Atrium: Normal  Intraatrial septum: Normal (mildly redundant)  Bubble Contrast Study: not performed  IMPRESSION: No vegetation.   Candee Furbish, MD

## 2021-11-15 ENCOUNTER — Encounter (HOSPITAL_COMMUNITY): Payer: Self-pay | Admitting: Cardiology

## 2021-11-15 LAB — CULTURE, BLOOD (ROUTINE X 2)
Culture: NO GROWTH
Culture: NO GROWTH
Special Requests: ADEQUATE
Special Requests: ADEQUATE
